# Patient Record
Sex: Female | Born: 1960 | Race: White | Hispanic: No | Marital: Married | Smoking: Never smoker
Health system: Southern US, Community
[De-identification: ages and names within clinical notes are randomized; demographics above are authoritative.]

## PROBLEM LIST (undated history)

## (undated) DIAGNOSIS — T8859XA Other complications of anesthesia, initial encounter: Secondary | ICD-10-CM

## (undated) DIAGNOSIS — Z9889 Other specified postprocedural states: Secondary | ICD-10-CM

## (undated) DIAGNOSIS — N200 Calculus of kidney: Secondary | ICD-10-CM

## (undated) DIAGNOSIS — Z87442 Personal history of urinary calculi: Secondary | ICD-10-CM

## (undated) DIAGNOSIS — D649 Anemia, unspecified: Secondary | ICD-10-CM

## (undated) DIAGNOSIS — J189 Pneumonia, unspecified organism: Secondary | ICD-10-CM

## (undated) HISTORY — PX: TUBAL LIGATION: SHX77

## (undated) HISTORY — PX: APPENDECTOMY: SHX54

## (undated) HISTORY — PX: ABDOMINAL HYSTERECTOMY: SHX81

## (undated) HISTORY — PX: COLON SURGERY: SHX602

## (undated) HISTORY — DX: Calculus of kidney: N20.0

---

## 2007-06-24 ENCOUNTER — Emergency Department: Payer: Self-pay | Admitting: Emergency Medicine

## 2008-05-24 DIAGNOSIS — D239 Other benign neoplasm of skin, unspecified: Secondary | ICD-10-CM

## 2008-05-24 HISTORY — DX: Other benign neoplasm of skin, unspecified: D23.9

## 2008-12-20 ENCOUNTER — Emergency Department: Payer: Self-pay | Admitting: Emergency Medicine

## 2011-06-04 ENCOUNTER — Ambulatory Visit: Payer: Self-pay | Admitting: Family Medicine

## 2015-01-17 ENCOUNTER — Ambulatory Visit: Payer: No Typology Code available for payment source

## 2015-01-17 ENCOUNTER — Ambulatory Visit
Admission: EM | Admit: 2015-01-17 | Discharge: 2015-01-17 | Disposition: A | Discharge status: Home / Self Care | Payer: No Typology Code available for payment source | Attending: Family Medicine | Admitting: Family Medicine

## 2015-01-17 DIAGNOSIS — J40 Bronchitis, not specified as acute or chronic: Secondary | ICD-10-CM | POA: Diagnosis not present

## 2015-01-17 DIAGNOSIS — J0100 Acute maxillary sinusitis, unspecified: Secondary | ICD-10-CM

## 2015-01-17 DIAGNOSIS — J0110 Acute frontal sinusitis, unspecified: Secondary | ICD-10-CM

## 2015-01-17 MED ORDER — AZITHROMYCIN 250 MG PO TABS: ORAL_TABLET | ORAL | Status: DC

## 2015-01-17 MED ORDER — PREDNISONE 20 MG PO TABS: 20.0000 mg | ORAL_TABLET | Freq: Every day | ORAL | Status: DC

## 2015-01-17 MED ORDER — GUAIFENESIN-CODEINE 100-10 MG/5ML PO SOLN: 5.0000 mL | Freq: Three times a day (TID) | ORAL | Status: DC | PRN

## 2015-01-17 NOTE — ED Provider Notes (Signed)
[facility] Emergency Department Provider Note  ____________________________________________  Time seen: Approximately 2:39 PM  I have reviewed the triage vital signs and the nursing notes.   HISTORY  Chief Complaint Cough and Nasal Congestion   HPI Margaret Sellers is a 54 y.o. female presents for the complaints of 2-3 weeks of runny nose, congestion, sinus pressure and cough. States in the last week cough has increased. Patient reports that she now feels like she is having a lot of chest congestion. States she is frequently getting yellowish greenish mucus from nose and from cough. Reports cough is worse at night when lying down she can feel drainage in the back of her throat. Denies fever. Denies wheezing.  Reports continues to eat and drink well. States sinus pressure discomfort is 3 out of 10. Denies other pain.  Denies chest pain, shortness breath, abdominal pain, fever, neck pain or other complaints. 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   History reviewed. No pertinent past medical history.  There are no active problems to display for this patient.   History reviewed. No pertinent past surgical history.  No current outpatient prescriptions on file.  Allergies Sulfa antibiotics  No family history on file.  Social History Social History  Substance Use Topics  . Smoking status: Never Smoker   . Smokeless tobacco: None  . Alcohol Use: No    Review of Systems Constitutional: No fever/chills Eyes: No visual changes. ENT: positive runny nose, congestion and cough. Cardiovascular: Denies chest pain. Respiratory: Denies shortness of breath. Gastrointestinal: No abdominal pain.  No nausea, no vomiting.  No diarrhea.  No constipation. Genitourinary: Negative for dysuria. Musculoskeletal: Negative for back pain. Skin: Negative for rash. Neurological: Negative for headaches, focal weakness or numbness.  10-point ROS otherwise  negative.  ____________________________________________   PHYSICAL EXAM:  VITAL SIGNS: ED Triage Vitals  Enc Vitals Group     BP 01/17/15 1358 119/61 mmHg     Pulse Rate 01/17/15 1358 70     Resp 01/17/15 1358 20     Temp 01/17/15 1358 97.8 F (36.6 C)     Temp Source 01/17/15 1358 Tympanic     SpO2 01/17/15 1358 100 %     Weight 01/17/15 1358 192 lb (87.091 kg)     Height 01/17/15 1358 5\' 6"  (1.676 m)     Head Cir --      Peak Flow --      Pain Score 01/17/15 1402 0     Pain Loc --      Pain Edu? --      Excl. in [place]? --     Constitutional: Alert and oriented. Well appearing and in no acute distress. Eyes: Conjunctivae are normal. PERRL. EOMI. Head: Atraumatic. Mild to mod TTP maxillary and frontal sinus TTP. No erythema. No swelling.   Ears: no erythema, normal TMs bilaterally.   Nose: nasal congestion with bilateral nasal turbinate edema and bogginess.   Mouth/Throat: Mucous membranes are moist.  Oropharynx non-erythematous. Neck: No stridor.  No cervical spine tenderness to palpation. Hematological/Lymphatic/Immunilogical: No cervical lymphadenopathy. Cardiovascular: Normal rate, regular rhythm. Grossly normal heart sounds.  Good peripheral circulation. Respiratory: Normal respiratory effort.  No retractions. Bilateral base rhonchi. No wheezes or rales. Good air movement. Intermittent dry cough in room.  Gastrointestinal: Soft and nontender. No distention. Normal Bowel sounds.  No CVA tenderness. Musculoskeletal: No lower or upper extremity tenderness nor edema.  No joint effusions. Bilateral pedal pulses equal and easily palpated.  Neurologic:  Normal speech and language. No gross focal neurologic deficits are appreciated. No gait instability. Skin:  Skin is warm, dry and intact. No rash noted. Psychiatric: Mood and affect are normal. Speech and behavior are normal.  ____________________________________________   LABS (all labs ordered are listed, but only abnormal  results are displayed)  Labs Reviewed - No data to display  RADIOLOGY  EXAM: CHEST 2 VIEW  COMPARISON: 08/06/2009  FINDINGS: Mild hyperinflation. Mild convex left thoracic spine curvature. Midline trachea. Normal heart size and mediastinal contours. No pleural effusion or pneumothorax. Biapical pleural thickening. Clear lungs.  IMPRESSION: Mild hyperinflation, without acute disease.   Electronically Signed By: Abigail Miyamoto M.D. On: 01/17/2015 15:21  I, Marylene Land, personally viewed and evaluated these images (plain radiographs) as part of my medical decision making.   ____________________________________________   INITIAL IMPRESSION / ASSESSMENT AND PLAN / ED COURSE  Pertinent labs & imaging results that were available during my care of the patient were reviewed by me and considered in my medical decision making (see chart for details).  Very well-appearing patient. No acute distress. Patient with active cough in room. Rhonchi bilateral base lungs, no wheezing, no rales. Moist mucous membranes. Abdomen soft and nontender. Nasal congestion and sinus pressure. Will also evaluate chest x-ray.  Chest xray mild hyperinflation, without acute distress. Will treat sinusitis and bronchitis with oral azithromycin, prn guaifenesin/codeine, and 3 days prednisone. Discussed supportive treatments including rest and fluids. Discussed follow up with Primary care physician this week. Discussed follow up and return parameters including no resolution or any worsening concerns. Patient verbalized understanding and agreed to plan.   ____________________________________________   FINAL CLINICAL IMPRESSION(S) / ED DIAGNOSES  Final diagnoses:  Acute maxillary sinusitis, recurrence not specified  Acute frontal sinusitis, recurrence not specified  Bronchitis       Marylene Land, NP 01/17/15 1539

## 2015-01-17 NOTE — ED Notes (Signed)
Pt states "I have congestion in my head for the last three weeks, but now I have this cough and feel worse. Yellow productive cough."

## 2015-01-17 NOTE — Discharge Instructions (Signed)
Sinusitis, Adult  Sinusitis is redness, soreness, and puffiness (inflammation) of the air pockets in the bones of your face (sinuses). The redness, soreness, and puffiness can cause air and mucus to get trapped in your sinuses. This can allow germs to grow and cause an infection.   HOME CARE    Drink enough fluids to keep your pee (urine) clear or pale yellow.   Use a humidifier in your home.   Run a hot shower to create steam in the bathroom. Sit in the bathroom with the door closed. Breathe in the steam 3-4 times a day.   Put a warm, moist washcloth on your face 3-4 times a day, or as told by your doctor.   Use salt water sprays (saline sprays) to wet the thick fluid in your nose. This can help the sinuses drain.   Only take medicine as told by your doctor.  GET HELP RIGHT AWAY IF:    Your pain gets worse.   You have very bad headaches.   You are sick to your stomach (nauseous).   You throw up (vomit).   You are very sleepy (drowsy) all the time.   Your face is puffy (swollen).   Your vision changes.   You have a stiff neck.   You have trouble breathing.  MAKE SURE YOU:    Understand these instructions.   Will watch your condition.   Will get help right away if you are not doing well or get worse.     This information is not intended to replace advice given to you by your health care provider. Make sure you discuss any questions you have with your health care provider.     Document Released: 09/10/2007 Document Revised: 04/14/2014 Document Reviewed: 10/28/2011  Elsevier Interactive Patient Education 2016 [employer]

## 2017-07-13 ENCOUNTER — Emergency Department
Admission: EM | Admit: 2017-07-13 | Discharge: 2017-07-13 | Disposition: A | Discharge status: Home / Self Care | Payer: BLUE CROSS/BLUE SHIELD | Attending: Student in an Organized Health Care Education/Training Program | Admitting: Student in an Organized Health Care Education/Training Program

## 2017-07-13 DIAGNOSIS — Z79899 Other long term (current) drug therapy: Secondary | ICD-10-CM | POA: Diagnosis not present

## 2017-07-13 DIAGNOSIS — T782XXA Anaphylactic shock, unspecified, initial encounter: Secondary | ICD-10-CM

## 2017-07-13 DIAGNOSIS — T886XXA Anaphylactic reaction due to adverse effect of correct drug or medicament properly administered, initial encounter: Secondary | ICD-10-CM | POA: Diagnosis not present

## 2017-07-13 DIAGNOSIS — R0989 Other specified symptoms and signs involving the circulatory and respiratory systems: Secondary | ICD-10-CM | POA: Diagnosis present

## 2017-07-13 DIAGNOSIS — Y829 Unspecified medical devices associated with adverse incidents: Secondary | ICD-10-CM | POA: Diagnosis not present

## 2017-07-13 MED ORDER — EPINEPHRINE 0.3 MG/0.3ML IJ SOAJ
0.3000 mg | Freq: Once | INTRAMUSCULAR | Status: AC
  Administered 2017-07-13: 0.3 mg via INTRAMUSCULAR

## 2017-07-13 MED ORDER — PREDNISONE 10 MG PO TABS: 10.0000 mg | ORAL_TABLET | Freq: Every day | ORAL | 0 refills | Status: DC

## 2017-07-13 MED ORDER — EPINEPHRINE 0.3 MG/0.3ML IJ SOAJ
INTRAMUSCULAR | Status: AC
  Administered 2017-07-13: 0.3 mg via INTRAMUSCULAR
  Filled 2017-07-13: qty 0.3

## 2017-07-13 MED ORDER — FAMOTIDINE IN NACL 20-0.9 MG/50ML-% IV SOLN
20.0000 mg | Freq: Once | INTRAVENOUS | Status: AC
  Administered 2017-07-13: 20 mg via INTRAVENOUS

## 2017-07-13 MED ORDER — EPINEPHRINE 0.3 MG/0.3ML IJ SOAJ: 0.3000 mg | Freq: Once | INTRAMUSCULAR | 0 refills | Status: AC

## 2017-07-13 NOTE — ED Provider Notes (Signed)
[facility] Emergency Department Provider Note    First MD Initiated Contact with Patient 07/13/17 1657     (approximate)  I have reviewed the triage vital signs and the nursing notes.   HISTORY  Chief Complaint Allergic Reaction     HPI Margaret Sellers is a 57 y.o. female presents with chief complaint of throat swelling redness and itching all over that occurred after she took Augmentin for an upper respiratory infection today.  Patient was divided by EMS was still protecting her airway she was given IM epinephrine as well as IV Solu-Medrol Benadryl and Zofran.  Patient felt like she was about to evacuate her bowels as well as having some nausea.  Has had allergic reactions to sulfa antibiotics with his first time she is taken penicillins.  No past medical history on file. No family history on file. No past surgical history on file. There are no active problems to display for this patient.     Prior to Admission medications   Medication Sig Start Date End Date Taking? Authorizing Provider  azithromycin (ZITHROMAX Z-PAK) 250 MG tablet Take 2 tablets (500 mg) on  Day 1,  followed by 1 tablet (250 mg) once daily on Days 2 through 5. 01/17/15   Marylene Land, NP  EPINEPHrine 0.3 mg/0.3 mL IJ SOAJ injection Inject 0.3 mLs (0.3 mg total) into the muscle once for 1 dose. 07/13/17 07/13/17  Merlyn Lot, MD  guaiFENesin-codeine 100-10 MG/5ML syrup Take 5 mLs by mouth 3 (three) times daily as needed for cough. 01/17/15   Marylene Land, NP  predniSONE (DELTASONE) 10 MG tablet Take 1 tablet (10 mg total) by mouth daily. Day 1-2: Take 50 mg  ( 5 pills) Day 3-4 : Take 40 mg (4pills) Day 5-6: Take 30 mg (3 pills) Day 7-8:  Take 20 mg (2 pills) Day 9:  Take 10mg  (1 pill) 07/13/17   Merlyn Lot, MD  predniSONE (DELTASONE) 20 MG tablet Take 1 tablet (20 mg total) by mouth daily. 01/17/15   Marylene Land, NP    Allergies Augmentin [amoxicillin-pot  clavulanate] and Sulfa antibiotics    Social History Social History   Tobacco Use  . Smoking status: Never Smoker  Substance Use Topics  . Alcohol use: No  . Drug use: No    Review of Systems Patient denies headaches, rhinorrhea, blurry vision, numbness, shortness of breath, chest pain, edema, cough, abdominal pain, nausea, vomiting, diarrhea, dysuria, fevers, rashes or hallucinations unless otherwise stated above in HPI. ____________________________________________   PHYSICAL EXAM:  VITAL SIGNS: Vitals:   07/13/17 1658 07/13/17 1701  BP:  (!) 164/70  Pulse:  93  Resp:  16  Temp:  (!) 97.2 F (36.2 C)  SpO2: 96% 97%    Constitutional: Alert and oriented.  Red with diffuse urticaria protecting her airway  eyes: Conjunctivae are normal.  Head: Atraumatic. Nose: No congestion/rhinnorhea. Mouth/Throat: Mucous membranes are moist.  Uvular edema or tongue swelling. Neck: No stridor. Painless ROM.  Cardiovascular: Normal rate, regular rhythm. Grossly normal heart sounds.  Good peripheral circulation. Respiratory: Normal respiratory effort.  No retractions. Lungs CTAB. Gastrointestinal: Soft and nontender. No distention. No abdominal bruits. No CVA tenderness. Genitourinary:  Musculoskeletal: No lower extremity tenderness nor edema.  No joint effusions. Neurologic:  Normal speech and language. No gross focal neurologic deficits are appreciated. No facial droop Skin:  Skin is warm, dry and intact. No rash noted. Psychiatric: Mood and affect are normal. Speech and behavior are normal.  ____________________________________________   LABS (all labs ordered are listed, but only abnormal results are displayed)  No results found for this or any previous visit (from the past 24 hour(s)). ____________________________________________ ____________________________________________  [id number]   ____________________________________________   PROCEDURES  Procedure(s)  performed:  .Critical Care Performed by: Merlyn Lot, MD Authorized by: Merlyn Lot, MD   Critical care provider statement:    Critical care time (minutes):  5   Critical care time was exclusive of:  Separately billable procedures and treating other patients   Critical care was necessary to treat or prevent imminent or life-threatening deterioration of the following conditions: anaphylaxis.   Critical care was time spent personally by me on the following activities:  Development of treatment plan with patient or surrogate, discussions with consultants, evaluation of patient's response to treatment, examination of patient, obtaining history from patient or surrogate, ordering and performing treatments and interventions, ordering and review of laboratory studies, ordering and review of radiographic studies, pulse oximetry, re-evaluation of patient's condition and review of old charts      Critical Care performed: yes ____________________________________________   INITIAL IMPRESSION / [place] / ED COURSE  Pertinent labs & imaging results that were available during my care of the patient were reviewed by me and considered in my medical decision making (see chart for details).  DDX: anaphylaxis, urticaria, anaphylactoid, sjs  Margaret Sellers is a 57 y.o. who presents to the ED with allergic reaction as described above.  Patient already received epi but will will give additional dose here and continue to observe.  She is currently protecting her airway.  Symptoms seem to be most likely be related to the Augmentin given for her sinusitis.  Possible food allergy.  Regardless given the severity of her symptoms will keep on monitor and reassess.  Clinical Course as of Jul 13 2037  Mon Jul 13, 2017  1725 Patient reassessed.  Symptoms are significantly improved.  No shortness of breath.  No stridor.  No more symptoms or sensation that she needs to evacuate her bowels or vomit.     [PR]  2039 Patient reassessed in no acute distress.  Symptoms have completely resolved.  At this point do believe she stable and appropriate for discharge home.  Discussed need for cessation of her antibiotic and follow-up with allergist.  Have discussed with the patient and available family all diagnostics and treatments performed thus far and all questions were answered to the best of my ability. The patient demonstrates understanding and agreement with plan.    [PR]    Clinical Course User Index [PR] Merlyn Lot, MD     As part of my medical decision making, I reviewed the following data within the [place] notes reviewed and incorporated, Labs reviewed, notes from prior ED visits.   ____________________________________________   FINAL CLINICAL IMPRESSION(S) / ED DIAGNOSES  Final diagnoses:  Anaphylaxis, initial encounter      NEW MEDICATIONS STARTED DURING THIS VISIT:  New Prescriptions   EPINEPHRINE 0.3 MG/0.3 ML IJ SOAJ INJECTION    Inject 0.3 mLs (0.3 mg total) into the muscle once for 1 dose.   PREDNISONE (DELTASONE) 10 MG TABLET    Take 1 tablet (10 mg total) by mouth daily. Day 1-2: Take 50 mg  ( 5 pills) Day 3-4 : Take 40 mg (4pills) Day 5-6: Take 30 mg (3 pills) Day 7-8:  Take 20 mg (2 pills) Day 9:  Take 10mg  (1 pill)  Note:  This document was prepared using Dragon voice recognition software and may include unintentional dictation errors.    Merlyn Lot, MD 07/13/17 2039

## 2017-07-13 NOTE — ED Triage Notes (Signed)
Pt brought in by [facility] from parking lot.  Pt was prescribed augmentin for URI and started to notice throat swelling, redness and itching all over.  PT called EMS and was given 125mg  solumedrol IV, 50mg  benadryl IV, 4mg  zofran IV, and 0.3mg  epinephrine IM via [facility] in route.  PT was also given a total of 600cc of fluid in route.  Pt otherwise has stable vitals at this time.  EDP at bedside and given VO for another epi pen IM injection and famotidine 20mg  IV at this time.

## 2018-02-28 ENCOUNTER — Ambulatory Visit
Admission: EM | Admit: 2018-02-28 | Discharge: 2018-02-28 | Disposition: A | Discharge status: Home / Self Care | Payer: Managed Care, Other (non HMO) | Attending: Family Medicine | Admitting: Family Medicine

## 2018-02-28 DIAGNOSIS — X58XXXA Exposure to other specified factors, initial encounter: Secondary | ICD-10-CM

## 2018-02-28 DIAGNOSIS — S0501XA Injury of conjunctiva and corneal abrasion without foreign body, right eye, initial encounter: Secondary | ICD-10-CM

## 2018-02-28 MED ORDER — MOXIFLOXACIN HCL 0.5 % OP SOLN: 1.0000 [drp] | Freq: Three times a day (TID) | OPHTHALMIC | 0 refills | Status: DC

## 2018-02-28 NOTE — ED Provider Notes (Signed)
[facility]    CSN: [id number] Arrival date & time: 02/28/18  0807     History   Chief Complaint Chief Complaint  Patient presents with  . Eye Pain    HPI Margaret Sellers is a 57 y.o. female.   57 yo female with a c/o right eye pain since this morning, associated with light sensitivity and watery. Denies any vision changes, thick discharge, or any eye injuries.  States she woke up this morning with the symptoms.   The history is provided by the patient.    History reviewed. No pertinent past medical history.  There are no active problems to display for this patient.   History reviewed. No pertinent surgical history.  OB History   None      Home Medications    Prior to Admission medications   Medication Sig Start Date End Date Taking? Authorizing Provider  azithromycin (ZITHROMAX Z-PAK) 250 MG tablet Take 2 tablets (500 mg) on  Day 1,  followed by 1 tablet (250 mg) once daily on Days 2 through 5. 01/17/15   Marylene Land, NP  guaiFENesin-codeine 100-10 MG/5ML syrup Take 5 mLs by mouth 3 (three) times daily as needed for cough. 01/17/15   Marylene Land, NP  moxifloxacin (VIGAMOX) 0.5 % ophthalmic solution Place 1 drop into the right eye 3 (three) times daily. 02/28/18   Norval Gable, MD  predniSONE (DELTASONE) 10 MG tablet Take 1 tablet (10 mg total) by mouth daily. Day 1-2: Take 50 mg  ( 5 pills) Day 3-4 : Take 40 mg (4pills) Day 5-6: Take 30 mg (3 pills) Day 7-8:  Take 20 mg (2 pills) Day 9:  Take 10mg  (1 pill) 07/13/17   Merlyn Lot, MD  predniSONE (DELTASONE) 20 MG tablet Take 1 tablet (20 mg total) by mouth daily. 01/17/15   Marylene Land, NP    Family History Family History  Problem Relation Age of Onset  . Healthy Mother   . Alzheimer's disease Father     Social History Social History   Tobacco Use  . Smoking status: Never Smoker  . Smokeless tobacco: Never Used  Substance Use Topics  . Alcohol use: No  . Drug use: No      Allergies   Augmentin [amoxicillin-pot clavulanate] and Sulfa antibiotics   Review of Systems Review of Systems   Physical Exam Triage Vital Signs ED Triage Vitals  Enc Vitals Group     BP 02/28/18 0817 137/74     Pulse Rate 02/28/18 0817 (!) 56     Resp 02/28/18 0817 18     Temp 02/28/18 0817 97.7 F (36.5 C)     Temp Source 02/28/18 0817 Oral     SpO2 02/28/18 0817 100 %     Weight 02/28/18 0819 210 lb (95.3 kg)     Height --      Head Circumference --      Peak Flow --      Pain Score 02/28/18 0819 8     Pain Loc --      Pain Edu? --      Excl. in [place]? --    No data found.  Updated Vital Signs BP 137/74 (BP Location: Right Arm)   Pulse (!) 56   Temp 97.7 F (36.5 C) (Oral)   Resp 18   Wt 95.3 kg   SpO2 100%   BMI 32.89 kg/m   Visual Acuity Right Eye Distance: 20/30(corrected) Left Eye Distance: 20/25(corrected) Bilateral Distance:  Right Eye Near:   Left Eye Near:    Bilateral Near:     Physical Exam  Constitutional: She appears well-developed and well-nourished. No distress.  Eyes: Pupils are equal, round, and reactive to light. EOM and lids are normal. Lids are everted and swept, no foreign bodies found. Right eye exhibits no discharge and no exudate. No foreign body present in the right eye. Left eye exhibits no discharge and no exudate. No foreign body present in the left eye. Right conjunctiva is injected.  Slit lamp exam:      The right eye shows corneal abrasion and fluorescein uptake.    Skin: She is not diaphoretic.  Nursing note and vitals reviewed.    UC Treatments / Results  Labs (all labs ordered are listed, but only abnormal results are displayed) Labs Reviewed - No data to display  EKG None  Radiology No results found.  Procedures Procedures (including critical care time)  Medications Ordered in UC Medications - No data to display  Initial Impression / Assessment and Plan / UC Course  I have reviewed the triage  vital signs and the nursing notes.  Pertinent labs & imaging results that were available during my care of the patient were reviewed by me and considered in my medical decision making (see chart for details).      Final Clinical Impressions(s) / UC Diagnoses   Final diagnoses:  Abrasion of right cornea, initial encounter     Discharge Instructions     Follow up with your eye doctor in the next 24-48 if symptoms not improving or if worsening    ED Prescriptions    Medication Sig Dispense Auth. Provider   moxifloxacin (VIGAMOX) 0.5 % ophthalmic solution Place 1 drop into the right eye 3 (three) times daily. 3 mL Norval Gable, MD     1. diagnosis reviewed with patient 2. rx as per orders above; reviewed possible side effects, interactions, risks and benefits  3. Recommend supportive treatment with otc analgesics prn 4. Follow-up prn if symptoms worsen or don't improve   Controlled Substance Prescriptions [place] Controlled Substance Registry consulted? Not Applicable   Norval Gable, MD 02/28/18 1113

## 2018-02-28 NOTE — Discharge Instructions (Signed)
Follow up with your eye doctor in the next 24-48 if symptoms not improving or if worsening

## 2018-02-28 NOTE — ED Triage Notes (Signed)
Pt here for right eye pain. States she woke up this morning and it was red and slightly painful but has gotten worse. States it's very sensitive to the light and feels like a "stabbing pain" It does look red and irritated. No vision changes reported. Does report watery eyes and drainage from the right eye.

## 2018-05-31 ENCOUNTER — Other Ambulatory Visit: Payer: Self-pay | Admitting: Family Medicine

## 2018-05-31 ENCOUNTER — Other Ambulatory Visit (HOSPITAL_COMMUNITY): Payer: Self-pay | Admitting: Family Medicine

## 2018-05-31 DIAGNOSIS — R319 Hematuria, unspecified: Secondary | ICD-10-CM

## 2018-06-08 ENCOUNTER — Ambulatory Visit: Admission: RE | Admit: 2018-06-08 | Payer: Managed Care, Other (non HMO) | Source: Ambulatory Visit

## 2018-06-09 ENCOUNTER — Ambulatory Visit
Admission: RE | Admit: 2018-06-09 | Discharge: 2018-06-09 | Disposition: A | Discharge status: Home / Self Care | Payer: 59 | Source: Ambulatory Visit | Attending: Family Medicine | Admitting: Family Medicine

## 2018-06-09 DIAGNOSIS — R319 Hematuria, unspecified: Secondary | ICD-10-CM | POA: Diagnosis not present

## 2018-06-09 MED ORDER — IOHEXOL 300 MG/ML  SOLN
125.0000 mL | Freq: Once | INTRAMUSCULAR | Status: AC | PRN
  Administered 2018-06-09: 125 mL via INTRAVENOUS

## 2018-06-10 DIAGNOSIS — N200 Calculus of kidney: Secondary | ICD-10-CM | POA: Insufficient documentation

## 2018-06-10 DIAGNOSIS — K769 Liver disease, unspecified: Secondary | ICD-10-CM | POA: Insufficient documentation

## 2018-07-13 ENCOUNTER — Telehealth: Payer: Self-pay | Admitting: Urology

## 2018-07-15 ENCOUNTER — Ambulatory Visit: Payer: Self-pay | Admitting: Urology

## 2018-07-20 ENCOUNTER — Telehealth (INDEPENDENT_AMBULATORY_CARE_PROVIDER_SITE_OTHER): Payer: Self-pay | Admitting: Urology

## 2018-07-20 ENCOUNTER — Other Ambulatory Visit: Payer: Self-pay

## 2018-07-20 DIAGNOSIS — N200 Calculus of kidney: Secondary | ICD-10-CM

## 2018-07-20 NOTE — Progress Notes (Signed)
Virtual Visit via Telephone Note  I connected with Margaret Sellers on 07/20/18 at 11:30 AM EDT by telephone and verified that I am speaking with the correct person using two identifiers.   I discussed the limitations, risks, security and privacy concerns of performing an evaluation and management service by telephone and the availability of in person appointments. We discussed the impact of the COVID-19 on the healthcare system, and the importance of social distancing and reducing patient and provider exposure. I also discussed with the patient that there may be a patient responsible charge related to this service. The patient expressed understanding and agreed to proceed.  Reason for visit: Microscopic hematuria, nephrolithiasis  History of Present Illness: Margaret Sellers is a 58 year old healthy female that is referred for microscopic hematuria with 4-10 RBCs per high-power field, and nonobstructive nephrolithiasis.  She denies any symptoms of flank pain at this time.  She has an extensive history of nephrolithiasis with reportedly 20 to 30 stone spontaneously passed, never requiring surgical intervention, with last stone passed in December 2019.  She works as an Therapist, sports at [employer].  She has a 7-pack-year smoking history, and quit 2 years ago.  She denies any other carcinogenic exposures.  Her past surgical history is notable for reportedly 20 cm of sigmoid colon resected for endometriosis approximately 12 years ago.  She underwent a CT urogram with her PCP on 06/09/2018 that showed no renal masses or filling defects, however there was a 5 mm right mid pole renal stone, as well as a 7 mm left lower pole stone, and 3 mm left mid pole stone.  The larger 7 mm left lower pole stone measured 1000HU, with 10 cm skin stone distance.  Not clearly able to be seen on scout CT.  She denies any history of gross hematuria.   Assessment and Plan: 58 year old healthy female with extensive history of nephrolithiasis and  nonobstructing stones currently on CT scan, as well as microscopic hematuria.  We discussed common possible etiologies of hematuria including BPH, malignancy, urolithiasis, medical renal disease, and idiopathic. Standard workup recommended by the [facility] includes imaging with CT urogram to assess the upper tracts, and cystoscopy.   We discussed general stone prevention strategies including adequate hydration with goal of producing 2.5 L of urine daily, increasing citric acid intake, increasing calcium intake during high oxalate meals, minimizing animal protein, and decreasing salt intake.  We discussed various treatment options for urolithiasis including observation with or without medical expulsive therapy, shockwave lithotripsy (SWL), ureteroscopy and laser lithotripsy with stent placement, and percutaneous nephrolithotomy.  We discussed that management is based on stone size, location, density, patient co-morbidities, and patient preference.   Stones <74mm in size have a >80% spontaneous passage rate. Data surrounding the use of tamsulosin for medical expulsive therapy is controversial, but meta analyses suggests it is most efficacious for distal stones between 5-80mm in size. Possible side effects include dizziness/lightheadedness, and retrograde ejaculation.  SWL has a lower stone free rate in a single procedure, but also a lower complication rate compared to ureteroscopy and avoids a stent and associated stent related symptoms. Possible complications include renal hematoma, steinstrasse, and need for additional treatment.  Ureteroscopy with laser lithotripsy and stent placement has a higher stone free rate than SWL in a single procedure, however increased complication rate including possible infection, ureteral injury, bleeding, and stent related morbidity. Common stent related symptoms include dysuria, urgency/frequency, and flank pain.  She would prefer to undergo shockwave lithotripsy, as she  would  like to avoid a ureteral stent.  Follow Up: -RTC 6-8 weeks for cystoscopy with KUB prior -Discuss Left SWL v URS again at that visit -24 hour urine in future in setting of extensive stone disease   I discussed the assessment and treatment plan with the patient. The patient was provided an opportunity to ask questions and all were answered. The patient agreed with the plan and demonstrated an understanding of the instructions.   The patient was advised to call back or seek an in-person evaluation if the symptoms worsen or if the condition fails to improve as anticipated.  I provided 16 minutes of non-face-to-face time during this encounter.   Billey Co, MD

## 2018-09-16 ENCOUNTER — Telehealth: Payer: Self-pay | Admitting: Radiology

## 2018-09-16 ENCOUNTER — Other Ambulatory Visit: Payer: Self-pay

## 2018-09-16 ENCOUNTER — Ambulatory Visit (INDEPENDENT_AMBULATORY_CARE_PROVIDER_SITE_OTHER): Payer: Managed Care, Other (non HMO) | Admitting: Urology

## 2018-09-16 ENCOUNTER — Ambulatory Visit
Admission: RE | Admit: 2018-09-16 | Discharge: 2018-09-16 | Disposition: A | Discharge status: Home / Self Care | Payer: 59 | Source: Ambulatory Visit | Attending: Urology | Admitting: Urology

## 2018-09-16 ENCOUNTER — Ambulatory Visit
Admission: RE | Admit: 2018-09-16 | Discharge: 2018-09-16 | Disposition: A | Discharge status: Home / Self Care | Payer: 59 | Attending: Urology | Admitting: Urology

## 2018-09-16 ENCOUNTER — Other Ambulatory Visit: Payer: Self-pay | Admitting: Radiology

## 2018-09-16 ENCOUNTER — Encounter: Payer: Self-pay | Admitting: Urology

## 2018-09-16 VITALS — BP 111/73 | HR 61 | Ht 67.0 in | Wt 216.0 lb

## 2018-09-16 DIAGNOSIS — N200 Calculus of kidney: Secondary | ICD-10-CM

## 2018-09-16 LAB — URINALYSIS, COMPLETE
Bilirubin, UA: NEGATIVE
Glucose, UA: NEGATIVE
Ketones, UA: NEGATIVE
Leukocytes,UA: NEGATIVE
Nitrite, UA: NEGATIVE
Protein,UA: NEGATIVE
Specific Gravity, UA: 1.025 (ref 1.005–1.030)
Urobilinogen, Ur: 4 mg/dL — ABNORMAL HIGH (ref 0.2–1.0)
pH, UA: 6 (ref 5.0–7.5)

## 2018-09-16 LAB — MICROSCOPIC EXAMINATION: Bacteria, UA: NONE SEEN

## 2018-09-16 NOTE — Patient Instructions (Signed)
Lithotripsy  Lithotripsy is a treatment that can sometimes help eliminate kidney stones and the pain that they cause. A form of lithotripsy, also known as extracorporeal shock wave lithotripsy, is a nonsurgical procedure that crushes a kidney stone with shock waves. These shock waves pass through your body and focus on the kidney stone. They cause the kidney stone to break up while it is still in the urinary tract. This makes it easier for the smaller pieces of stone to pass in the urine. Tell a health care provider about:  Any allergies you have.  All medicines you are taking, including vitamins, herbs, eye drops, creams, and over-the-counter medicines.  Any blood disorders you have.  Any surgeries you have had.  Any medical conditions you have.  Whether you are pregnant or may be pregnant.  Any problems you or family members have had with anesthetic medicines. What are the risks? Generally, this is a safe procedure. However, problems may occur, including:  Infection.  Bleeding of the kidney.  Bruising of the kidney or skin.  Scarring of the kidney, which can lead to: ? Increased blood pressure. ? Poor kidney function. ? Return (recurrence) of kidney stones.  Damage to other structures or organs, such as the liver, colon, spleen, or pancreas.  Blockage (obstruction) of the the tube that carries urine from the kidney to the bladder (ureter).  Failure of the kidney stone to break into pieces (fragments). What happens before the procedure? Staying hydrated Follow instructions from your health care provider about hydration, which may include:  Up to 2 hours before the procedure - you may continue to drink clear liquids, such as water, clear fruit juice, black coffee, and plain tea. Eating and drinking restrictions Follow instructions from your health care provider about eating and drinking, which may include:  8 hours before the procedure - stop eating heavy meals or foods  such as meat, fried foods, or fatty foods.  6 hours before the procedure - stop eating light meals or foods, such as toast or cereal.  6 hours before the procedure - stop drinking milk or drinks that contain milk.  2 hours before the procedure - stop drinking clear liquids. General instructions  Plan to have someone take you home from the hospital or clinic.  Ask your health care provider about: ? Changing or stopping your regular medicines. This is especially important if you are taking diabetes medicines or blood thinners. ? Taking medicines such as aspirin and ibuprofen. These medicines and other NSAIDs can thin your blood. Do not take these medicines for 7 days before your procedure if your health care provider instructs you not to.  You may have tests, such as: ? Blood tests. ? Urine tests. ? Imaging tests, such as a CT scan. What happens during the procedure?  To lower your risk of infection: ? Your health care team will wash or sanitize their hands. ? Your skin will be washed with soap.  An IV tube will be inserted into one of your veins. This tube will give you fluids and medicines.  You will be given one or more of the following: ? A medicine to help you relax (sedative). ? A medicine to make you fall asleep (general anesthetic).  A water-filled cushion may be placed behind your kidney or on your abdomen. In some cases you may be placed in a tub of lukewarm water.  Your body will be positioned in a way that makes it easy to target the kidney   stone.  A flexible tube with holes in it (stent) may be placed in the ureter. This will help keep urine flowing from the kidney if the fragments of the stone have been blocking the ureter.  An X-ray or ultrasound exam will be done to locate your stone.  Shock waves will be aimed at the stone. If you are awake, you may feel a tapping sensation as the shock waves pass through your body. The procedure may vary among health care  providers and hospitals. What happens after the procedure?  You may have an X-ray to see whether the procedure was able to break up the kidney stone and how much of the stone has passed. If large stone fragments remain after treatment, you may need to have a second procedure at a later time.  Your blood pressure, heart rate, breathing rate, and blood oxygen level will be monitored until the medicines you were given have worn off.  You may be given antibiotics or pain medicine as needed.  If a stent was placed in your ureter during surgery, it may stay in place for a few weeks.  You may need strain your urine to collect pieces of the kidney stone for testing.  You will need to drink plenty of water.  Do not drive for 24 hours if you were given a sedative. Summary  Lithotripsy is a treatment that can sometimes help eliminate kidney stones and the pain that they cause.  A form of lithotripsy, also known as extracorporeal shock wave lithotripsy, is a nonsurgical procedure that crushes a kidney stone with shock waves.  Generally, this is a safe procedure. However, problems may occur, including damage to the kidney or other organs, infection, or obstruction of the tube that carries urine from the kidney to the bladder (ureter).  When you go home, you will need to drink plenty of water. You may be asked to strain your urine to collect pieces of the kidney stone for testing. This information is not intended to replace advice given to you by your health care provider. Make sure you discuss any questions you have with your health care provider. Document Released: 03/21/2000 Document Revised: 04/09/2017 Document Reviewed: 02/13/2016 Elsevier Interactive Patient Education  2019 [employer]  

## 2018-09-16 NOTE — Telephone Encounter (Signed)
Notified patient of extracorporeal shockwave lithotripsy scheduled on 10/14/2018 at 7:15. Patient should arrive at that time to [facility] Day [facility]. Pre-op & discharge instructions for lithotripsy were provided to patient.  A COVID-19 test will be performed on 10/11/2018. Patient was advised of location & to quarantine following the test until the day of the procedure.  Advised patient to hold NSAIDS & aspirin products for 3 days prior to the procedure beginning on 10/11/2018.  Patient's questions were answered & she expresses understanding of these instructions.    Ranell Patrick, RN

## 2018-09-16 NOTE — Progress Notes (Signed)
Cystoscopy Procedure Note:  Indication: Microscopic hematuria  After informed consent and discussion of the procedure and its risks, Margaret Sellers was positioned and prepped in the standard fashion. Cystoscopy was performed with a flexible cystoscope. The urethra, bladder neck and entire bladder was visualized in a standard fashion. The bladder mucosa was grossly normal throughout. The ureteral orifices were visualized in their normal location and orientation.  No abnormalities on retroflexion.  Imaging: CT and KUB personally reviewed. No renal masses, filling defects, or hydronephrosis. Left 37mm lower pole stone clearly visible on KUB. 1000HU, 10cm SSD.  Findings: Normal cystoscopy  Assessment and Plan: 58 year old female with history of bowel resection and > 20 spontaneously passed kidney stones, as well as microscopic hematuria.  Cystoscopy normal today, and no worrisome findings on CT for malignancy.  We discussed various treatment options for urolithiasis including observation with or without medical expulsive therapy, shockwave lithotripsy (SWL), ureteroscopy and laser lithotripsy with stent placement, and percutaneous nephrolithotomy.  We discussed that management is based on stone size, location, density, patient co-morbidities, and patient preference.   Stones <9mm in size have a >80% spontaneous passage rate. Data surrounding the use of tamsulosin for medical expulsive therapy is controversial, but meta analyses suggests it is most efficacious for distal stones between 5-41mm in size. Possible side effects include dizziness/lightheadedness, and retrograde ejaculation.  SWL has a lower stone free rate in a single procedure, but also a lower complication rate compared to ureteroscopy and avoids a stent and associated stent related symptoms. Possible complications include renal hematoma, steinstrasse, and need for additional treatment.  Ureteroscopy with laser lithotripsy and stent  placement has a higher stone free rate than SWL in a single procedure, however increased complication rate including possible infection, ureteral injury, bleeding, and stent related morbidity. Common stent related symptoms include dysuria, urgency/frequency, and flank pain.  She would like to proceed with shockwave lithotripsy, and she would like to pursue the least invasive approach.  We discussed at length the etiology of stone formations in patients with bowel resection, including the importance of adequate urine volume > 2.5 L/day, addition of citrate in the diet, and most importantly minimizing high oxalate foods and taking in calcium at the same time to reduce renal absorption of oxalate.  Schedule left SWL  Nickolas Madrid, MD 09/16/2018

## 2018-09-17 ENCOUNTER — Other Ambulatory Visit: Payer: Self-pay | Admitting: Radiology

## 2018-09-17 DIAGNOSIS — N200 Calculus of kidney: Secondary | ICD-10-CM

## 2018-10-04 ENCOUNTER — Other Ambulatory Visit: Payer: Managed Care, Other (non HMO)

## 2018-10-07 ENCOUNTER — Telehealth (INDEPENDENT_AMBULATORY_CARE_PROVIDER_SITE_OTHER): Payer: Managed Care, Other (non HMO) | Admitting: Urology

## 2018-10-07 ENCOUNTER — Other Ambulatory Visit: Payer: Self-pay

## 2018-10-07 DIAGNOSIS — N200 Calculus of kidney: Secondary | ICD-10-CM | POA: Diagnosis not present

## 2018-10-07 NOTE — Progress Notes (Signed)
Virtual Visit via Telephone Note  I connected with [name] on 10/07/18 at 12:00 PM EDT by telephone and verified that I am speaking with the correct person using two identifiers.   I discussed the limitations, risks, security and privacy concerns of performing an evaluation and management service by telephone and the availability of in person appointments. We discussed the impact of the COVID-19 pandemic on the healthcare system, and the importance of social distancing and reducing patient and provider exposure. I also discussed with the patient that there may be a patient responsible charge related to this service. The patient expressed understanding and agreed to proceed.  Reason for visit: Nephrolithiasis  History of Present Illness: [name] is a healthy 58 year old female with history of bariatric surgery and 20+ stone episodes in the past.  On microscopic hematuria work-up she was found to have a 7 mm left lower pole stone, 1000HU, 10cm SSD, clearly seen on KUB.  We had previously discussed options for the stone including surveillance, SWL, or ureteroscopy/laser lithotripsy/stent.  She had elected for SWL.   Apparently [facility] is out of her insurance network and she would have to pay out-of-pocket for SWL.  She wanted to discuss the risks and benefits of ureteroscopy today.  Assessment and Plan: We had a long conversation about the differences between SWL and ureteroscopy today.  We discussed the higher stone free rate with ureteroscopy, but the higher risk of postoperative stent related pain, UTI, and ER visits.  We also discussed surveillance is very reasonable option with her asymptomatic stone, however it would be unlikely to pass if it did move into the ureter with its 7 mm size.  She strongly prefers intervention for the stone prior to becoming symptomatic.  Follow Up: Will review cost of ureteroscopy with her insurance coverage so she can make a decision between SWL without  insurance coverage versus ureteroscopy with coverage.  Schedule LEFT SWL or ureteroscopy per patient preference in the near future   I discussed the assessment and treatment plan with the patient. The patient was provided an opportunity to ask questions and all were answered. The patient agreed with the plan and demonstrated an understanding of the instructions.   The patient was advised to call back or seek an in-person evaluation if the symptoms worsen or if the condition fails to improve as anticipated.  I provided 12 minutes of non-face-to-face time during this encounter.   Billey Co, MD

## 2018-10-11 ENCOUNTER — Encounter: Payer: Self-pay | Admitting: Radiology

## 2018-10-11 ENCOUNTER — Other Ambulatory Visit: Payer: Self-pay | Admitting: Radiology

## 2018-10-11 ENCOUNTER — Other Ambulatory Visit: Payer: 59

## 2018-10-11 DIAGNOSIS — N200 Calculus of kidney: Secondary | ICD-10-CM

## 2018-10-14 ENCOUNTER — Encounter: Admission: RE | Payer: Self-pay

## 2018-10-14 ENCOUNTER — Ambulatory Visit: Admission: RE | Admit: 2018-10-14 | Payer: Managed Care, Other (non HMO) | Admitting: Urology

## 2018-10-14 SURGERY — LITHOTRIPSY, ESWL
Anesthesia: Moderate Sedation | Laterality: Left

## 2018-10-27 ENCOUNTER — Telehealth: Payer: Self-pay

## 2018-10-28 ENCOUNTER — Ambulatory Visit: Payer: Managed Care, Other (non HMO) | Admitting: Urology

## 2018-11-01 ENCOUNTER — Telehealth: Payer: Self-pay | Admitting: Radiology

## 2018-11-01 ENCOUNTER — Other Ambulatory Visit: Payer: Managed Care, Other (non HMO)

## 2018-11-01 NOTE — Telephone Encounter (Signed)
Per pre-admission testing office, Pt stated that she wasnt having surgery as scheduled on 7/31 and refused the preadmit appt.  Called to discuss but vm is full and couldn't LMOM.

## 2018-11-02 ENCOUNTER — Other Ambulatory Visit: Admission: RE | Admit: 2018-11-02 | Payer: 59 | Source: Ambulatory Visit

## 2018-11-05 ENCOUNTER — Encounter: Admission: RE | Payer: Self-pay | Source: Home / Self Care

## 2018-11-05 ENCOUNTER — Ambulatory Visit: Admission: RE | Admit: 2018-11-05 | Payer: 59 | Source: Home / Self Care | Admitting: Urology

## 2018-11-05 SURGERY — CYSTOSCOPY/URETEROSCOPY/HOLMIUM LASER/STENT PLACEMENT
Anesthesia: Choice | Laterality: Left

## 2019-05-27 ENCOUNTER — Other Ambulatory Visit: Payer: Self-pay | Admitting: Family Medicine

## 2019-05-27 DIAGNOSIS — F411 Generalized anxiety disorder: Secondary | ICD-10-CM | POA: Insufficient documentation

## 2019-05-27 DIAGNOSIS — Z1231 Encounter for screening mammogram for malignant neoplasm of breast: Secondary | ICD-10-CM

## 2020-05-28 ENCOUNTER — Other Ambulatory Visit: Payer: Self-pay | Admitting: Family Medicine

## 2020-05-28 DIAGNOSIS — Z13220 Encounter for screening for lipoid disorders: Secondary | ICD-10-CM | POA: Diagnosis not present

## 2020-05-28 DIAGNOSIS — Z6834 Body mass index (BMI) 34.0-34.9, adult: Secondary | ICD-10-CM | POA: Diagnosis not present

## 2020-05-28 DIAGNOSIS — E6609 Other obesity due to excess calories: Secondary | ICD-10-CM | POA: Diagnosis not present

## 2020-05-28 DIAGNOSIS — R7303 Prediabetes: Secondary | ICD-10-CM | POA: Diagnosis not present

## 2020-05-28 DIAGNOSIS — F419 Anxiety disorder, unspecified: Secondary | ICD-10-CM | POA: Diagnosis not present

## 2020-05-28 DIAGNOSIS — Z0000 Encounter for general adult medical examination without abnormal findings: Secondary | ICD-10-CM | POA: Diagnosis not present

## 2020-05-28 DIAGNOSIS — F411 Generalized anxiety disorder: Secondary | ICD-10-CM | POA: Diagnosis not present

## 2020-05-28 DIAGNOSIS — R5383 Other fatigue: Secondary | ICD-10-CM | POA: Diagnosis not present

## 2020-05-28 DIAGNOSIS — K769 Liver disease, unspecified: Secondary | ICD-10-CM

## 2020-06-06 ENCOUNTER — Other Ambulatory Visit: Payer: Self-pay

## 2020-06-06 ENCOUNTER — Ambulatory Visit
Admission: RE | Admit: 2020-06-06 | Discharge: 2020-06-06 | Disposition: A | Discharge status: Home / Self Care | Payer: 59 | Source: Ambulatory Visit | Attending: Family Medicine | Admitting: Family Medicine

## 2020-06-06 DIAGNOSIS — K760 Fatty (change of) liver, not elsewhere classified: Secondary | ICD-10-CM | POA: Diagnosis not present

## 2020-06-06 DIAGNOSIS — K769 Liver disease, unspecified: Secondary | ICD-10-CM | POA: Insufficient documentation

## 2020-06-11 ENCOUNTER — Other Ambulatory Visit (HOSPITAL_COMMUNITY): Payer: Self-pay | Admitting: Family Medicine

## 2020-06-11 ENCOUNTER — Other Ambulatory Visit: Payer: Self-pay | Admitting: Family Medicine

## 2020-06-11 DIAGNOSIS — K7689 Other specified diseases of liver: Secondary | ICD-10-CM

## 2020-06-23 ENCOUNTER — Ambulatory Visit
Admission: RE | Admit: 2020-06-23 | Discharge: 2020-06-23 | Disposition: A | Discharge status: Home / Self Care | Payer: 59 | Source: Ambulatory Visit | Attending: Family Medicine | Admitting: Family Medicine

## 2020-06-23 ENCOUNTER — Other Ambulatory Visit: Payer: Self-pay

## 2020-06-23 DIAGNOSIS — N281 Cyst of kidney, acquired: Secondary | ICD-10-CM | POA: Diagnosis not present

## 2020-06-23 DIAGNOSIS — K7689 Other specified diseases of liver: Secondary | ICD-10-CM | POA: Diagnosis not present

## 2020-06-23 DIAGNOSIS — K5750 Diverticulosis of both small and large intestine without perforation or abscess without bleeding: Secondary | ICD-10-CM | POA: Diagnosis not present

## 2020-06-23 DIAGNOSIS — K449 Diaphragmatic hernia without obstruction or gangrene: Secondary | ICD-10-CM | POA: Diagnosis not present

## 2020-06-23 MED ORDER — GADOBUTROL 1 MMOL/ML IV SOLN
10.0000 mL | Freq: Once | INTRAVENOUS | Status: AC | PRN
  Administered 2020-06-23: 10 mL via INTRAVENOUS

## 2020-07-09 DIAGNOSIS — E669 Obesity, unspecified: Secondary | ICD-10-CM | POA: Diagnosis not present

## 2020-07-09 DIAGNOSIS — T700XXA Otitic barotrauma, initial encounter: Secondary | ICD-10-CM | POA: Diagnosis not present

## 2020-07-30 IMAGING — CT CT ABD-PEL WO/W CM
3 of 12 series · 12 of 46 positions shown, 18 images · IV contrast (omnipaque)
Comparison: Abdominal ultrasound of 12/21/2008

CLINICAL DATA: Microscopic hematuria.

EXAM:
CT ABDOMEN AND PELVIS WITHOUT AND WITH CONTRAST
TECHNIQUE: Multidetector CT imaging of the abdomen and pelvis was performed
following the standard protocol before and following the bolus
administration of intravenous contrast.
CONTRAST:  125mL OMNIPAQUE IOHEXOL 300 MG/ML  SOLN

[Series 2: without pre · axial · non-contrast · 0.84mm/px · z∈[-1562,-1422]mm · 3 of 100 slices shown]
[im 15/100  soft-tissue]
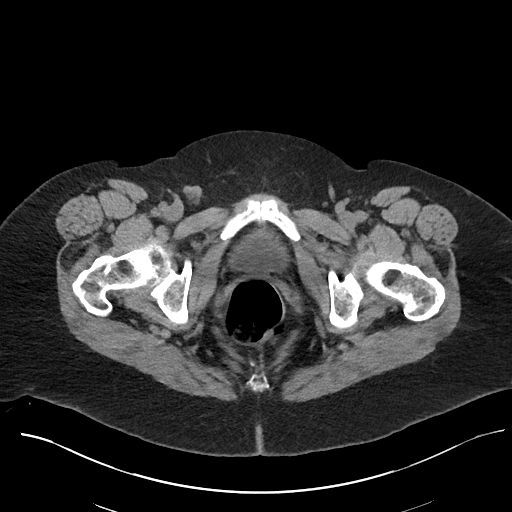
[im 29/100  soft-tissue]
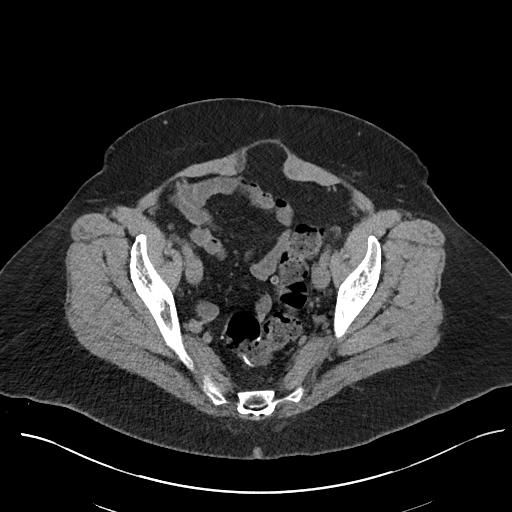
[im 43/100  soft-tissue]
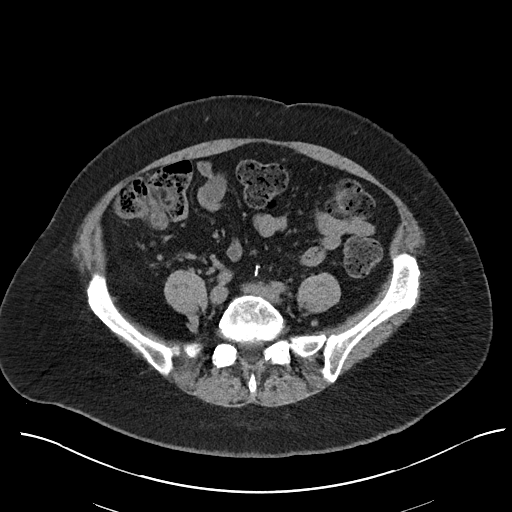

[Series 5: cor without without pre · coronal · non-contrast · 0.84mm/px · 2 of 160 slices shown, 3 images]
[im 54/160  soft-tissue]
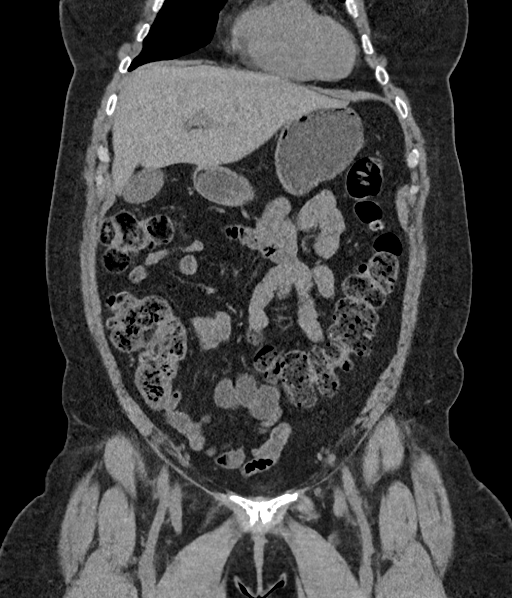
[im 54/160  bone]
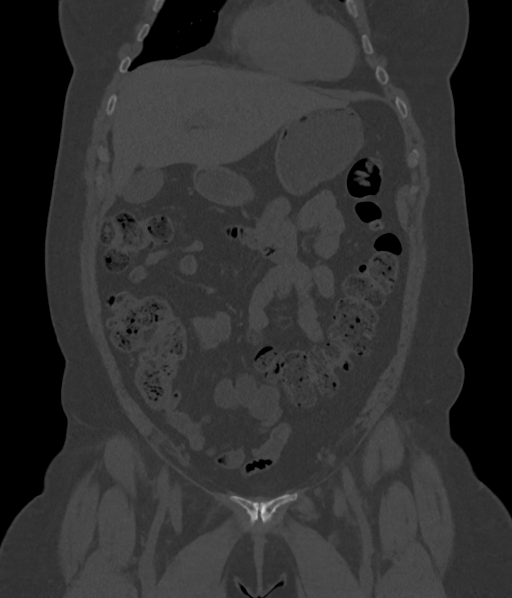
[im 107/160  soft-tissue]
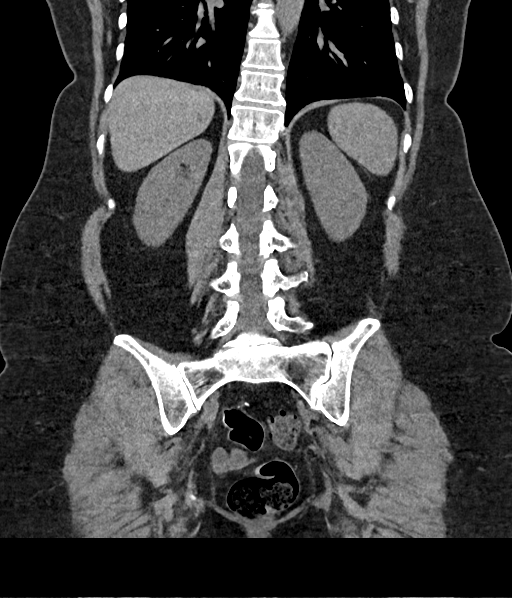

[Series 17: axial delay delay prone · axial · delayed · 0.83mm/px · z∈[-1465,-1090]mm · 7 of 101 slices shown, 12 images]
[im 13/101  soft-tissue]
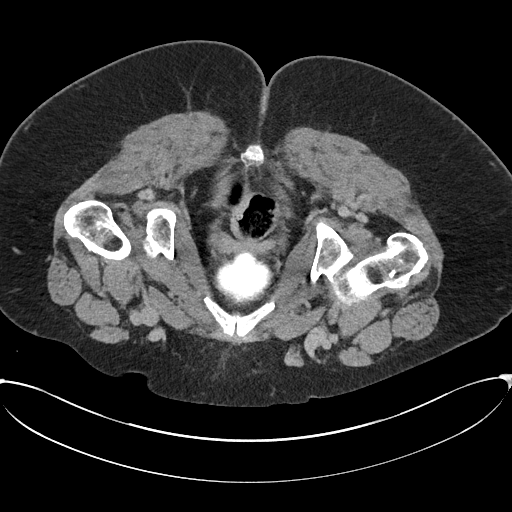
[im 13/101  bone]
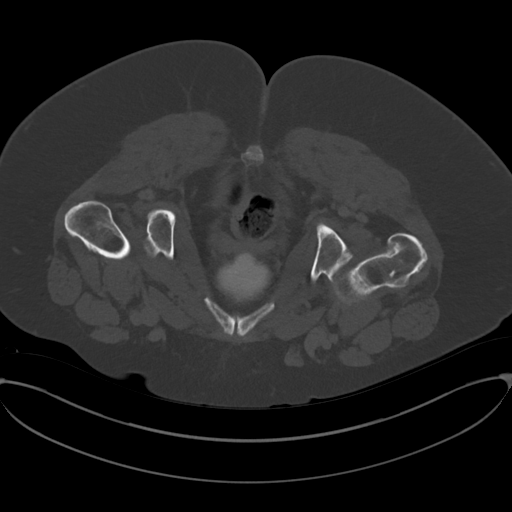
[im 26/101  soft-tissue]
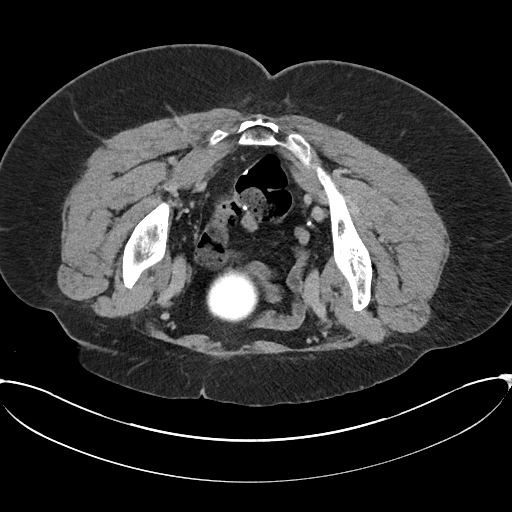
[im 38/101  soft-tissue]
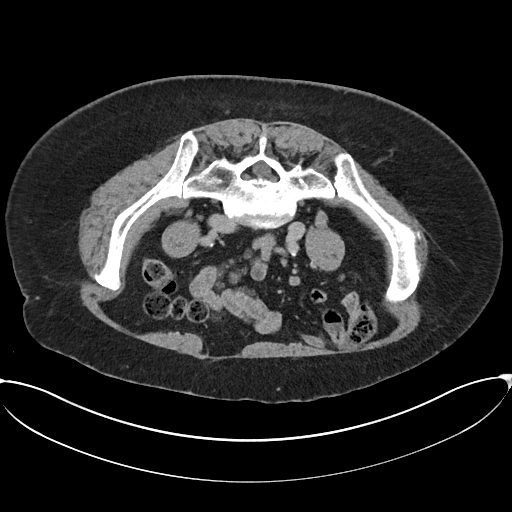
[im 51/101  soft-tissue]
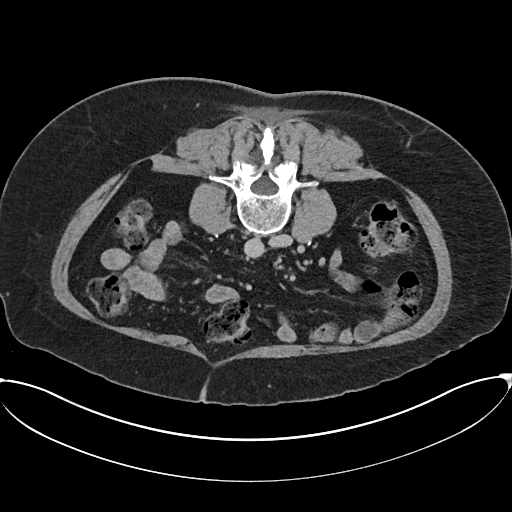
[im 51/101  lung]
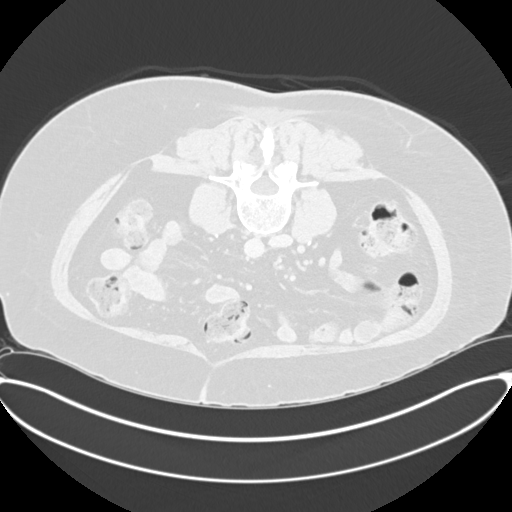
[im 63/101  soft-tissue]
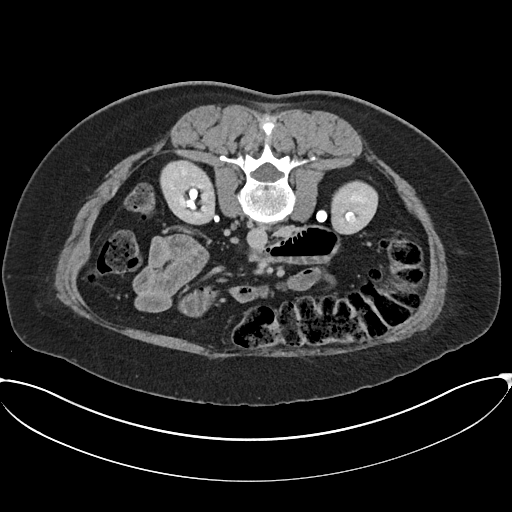
[im 63/101  lung]
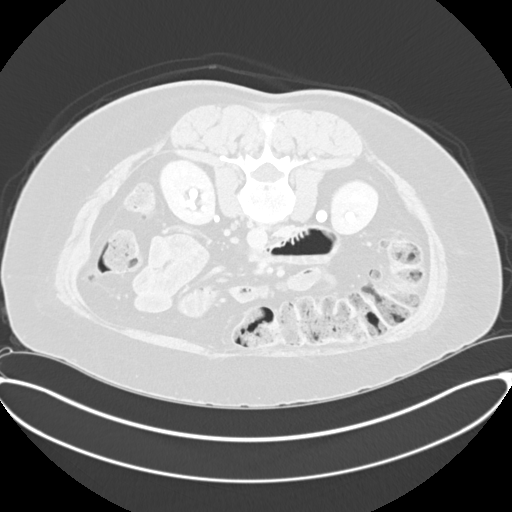
[im 76/101  soft-tissue]
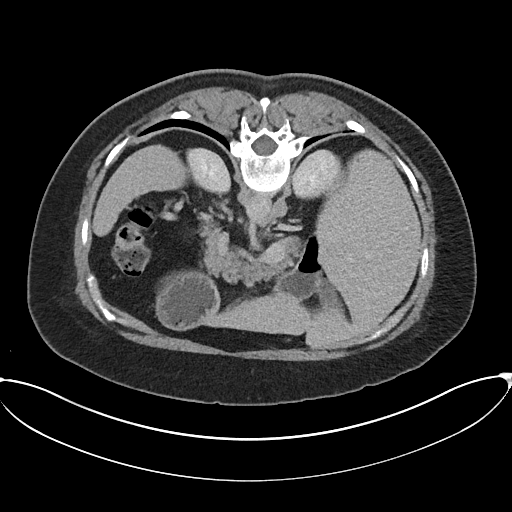
[im 76/101  lung]
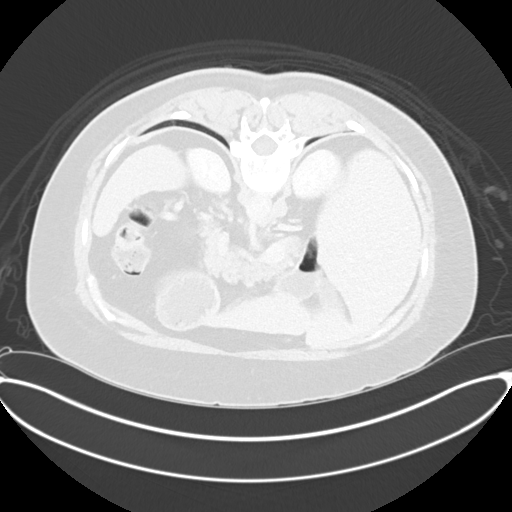
[im 88/101  soft-tissue]
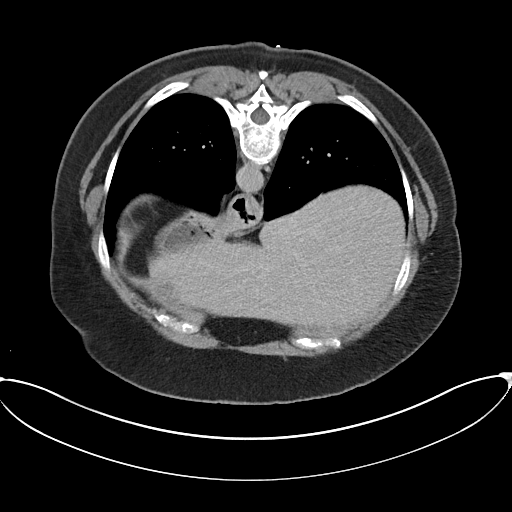
[im 88/101  lung]
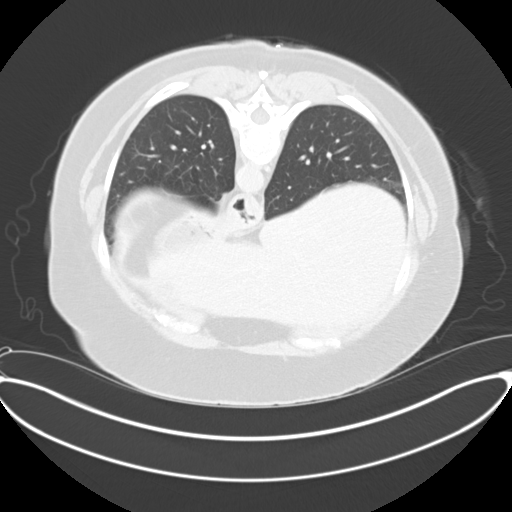

[12 of 46 positions shown; findings below may reference images not displayed]

FINDINGS: Lower chest: Small type 1 hiatal hernia.

Hepatobiliary: Suspected 5 mm hypodense right hepatic lobe lesion on
image [DATE] and suspected 4 mm hypodense lesion in the right hepatic
lobe on image [DATE]. These lesions are technically too small to
characterize. Gallbladder unremarkable. No biliary dilatation.

Pancreas: Unremarkable

Spleen: Unremarkable

Adrenals/Urinary Tract: Both adrenal glands appear normal.

5 mm right mid kidney nonobstructive renal calculus, image 99/5. 7
mm nonobstructive left kidney lower pole calculus, image 98/5.

3 mm left kidney lower pole nonobstructive calculus, image 103/5. No
additional urinary tract calculi. No hydronephrosis or hydroureter.
No abnormal renal parenchymal enhancement. No abnormal enhancement
or additional filling defect along the urothelium.

Stomach/Bowel: Postoperative findings in the rectum.

Vascular/Lymphatic: Unremarkable

Reproductive: Uterus absent.  Adnexa unremarkable.

Other: No supplemental non-categorized findings.

Musculoskeletal: Loss of disc height at L5-S1 with mild endplate
sclerosis borderline left foraminal narrowing due to facet and
intervertebral spurring.
IMPRESSION: 1. Bilateral nonobstructive nephrolithiasis.
2. Two tiny hypodense lesions in the right hepatic lobe are
technically nonspecific although statistically likely to be benign.
Postoperative findings in the rectum are reportedly related to prior
endometriosis rather than bowel malignancy, and accordingly further
workup of the potential liver lesions is considered optional.
3. Small type 1 hiatal hernia.
4. Degenerative disc disease and spondylosis at L5-S1.

## 2020-10-09 ENCOUNTER — Other Ambulatory Visit: Payer: Self-pay

## 2020-10-09 MED ORDER — ESCITALOPRAM OXALATE 10 MG PO TABS
10.0000 mg | ORAL_TABLET | Freq: Every day | ORAL | 10 refills | Status: DC
  Filled 2020-10-09: qty 30, 30d supply, fill #0
  Filled 2020-11-01: qty 30, 30d supply, fill #1

## 2020-10-09 MED ORDER — SEMAGLUTIDE-WEIGHT MANAGEMENT 0.25 MG/0.5ML ~~LOC~~ SOAJ: 0.2500 mg | SUBCUTANEOUS | 0 refills | Status: DC

## 2020-10-09 MED ORDER — PHENTERMINE HCL 37.5 MG PO TABS
37.5000 mg | ORAL_TABLET | Freq: Every day | ORAL | 1 refills | Status: DC
  Filled 2020-10-09: qty 30, 30d supply, fill #0

## 2020-10-29 ENCOUNTER — Other Ambulatory Visit: Payer: Self-pay

## 2020-10-29 DIAGNOSIS — Z6834 Body mass index (BMI) 34.0-34.9, adult: Secondary | ICD-10-CM | POA: Diagnosis not present

## 2020-10-29 DIAGNOSIS — M7061 Trochanteric bursitis, right hip: Secondary | ICD-10-CM | POA: Diagnosis not present

## 2020-10-29 DIAGNOSIS — E6609 Other obesity due to excess calories: Secondary | ICD-10-CM | POA: Diagnosis not present

## 2020-10-29 DIAGNOSIS — F411 Generalized anxiety disorder: Secondary | ICD-10-CM | POA: Diagnosis not present

## 2020-10-29 MED ORDER — PREDNISONE 20 MG PO TABS
ORAL_TABLET | ORAL | 0 refills | Status: DC
  Filled 2020-10-29: qty 9, 6d supply, fill #0

## 2020-10-29 MED ORDER — SAXENDA 18 MG/3ML ~~LOC~~ SOPN
PEN_INJECTOR | SUBCUTANEOUS | 0 refills | Status: DC
  Filled 2020-10-29 – 2020-11-16 (×5): qty 15, 30d supply, fill #0

## 2020-10-31 ENCOUNTER — Other Ambulatory Visit: Payer: Self-pay

## 2020-10-31 ENCOUNTER — Ambulatory Visit: Payer: 59 | Admitting: Dermatology

## 2020-10-31 ENCOUNTER — Encounter: Payer: Self-pay | Admitting: Dermatology

## 2020-10-31 DIAGNOSIS — L820 Inflamed seborrheic keratosis: Secondary | ICD-10-CM | POA: Diagnosis not present

## 2020-10-31 DIAGNOSIS — Z1283 Encounter for screening for malignant neoplasm of skin: Secondary | ICD-10-CM | POA: Diagnosis not present

## 2020-10-31 DIAGNOSIS — M67442 Ganglion, left hand: Secondary | ICD-10-CM

## 2020-10-31 DIAGNOSIS — L570 Actinic keratosis: Secondary | ICD-10-CM

## 2020-10-31 DIAGNOSIS — D485 Neoplasm of uncertain behavior of skin: Secondary | ICD-10-CM | POA: Diagnosis not present

## 2020-10-31 DIAGNOSIS — D1800 Hemangioma unspecified site: Secondary | ICD-10-CM

## 2020-10-31 DIAGNOSIS — L814 Other melanin hyperpigmentation: Secondary | ICD-10-CM | POA: Diagnosis not present

## 2020-10-31 DIAGNOSIS — Z86018 Personal history of other benign neoplasm: Secondary | ICD-10-CM

## 2020-10-31 DIAGNOSIS — B078 Other viral warts: Secondary | ICD-10-CM | POA: Diagnosis not present

## 2020-10-31 DIAGNOSIS — D225 Melanocytic nevi of trunk: Secondary | ICD-10-CM | POA: Diagnosis not present

## 2020-10-31 DIAGNOSIS — M67449 Ganglion, unspecified hand: Secondary | ICD-10-CM

## 2020-10-31 DIAGNOSIS — L578 Other skin changes due to chronic exposure to nonionizing radiation: Secondary | ICD-10-CM

## 2020-10-31 DIAGNOSIS — L280 Lichen simplex chronicus: Secondary | ICD-10-CM

## 2020-10-31 DIAGNOSIS — L821 Other seborrheic keratosis: Secondary | ICD-10-CM

## 2020-10-31 DIAGNOSIS — D229 Melanocytic nevi, unspecified: Secondary | ICD-10-CM

## 2020-10-31 DIAGNOSIS — D492 Neoplasm of unspecified behavior of bone, soft tissue, and skin: Secondary | ICD-10-CM

## 2020-10-31 MED ORDER — TRIAMCINOLONE ACETONIDE 0.1 % EX OINT
TOPICAL_OINTMENT | CUTANEOUS | 2 refills | Status: DC
  Filled 2020-10-31: qty 15, 30d supply, fill #0

## 2020-10-31 NOTE — Patient Instructions (Addendum)
Cryotherapy  Cryotherapy is the treatment of lesions with the application of a cold substance.  In most cases, liquid nitrogen is used to destroy the lesion(s).  Liquid nitrogen is so cold, -196 Celsius, it feels like it is burning when it is applied.  After treatment with liquid nitrogen, there may be some burning sensation or pain that can last up to 24 hours.  The area may also be swollen and red.  The discomfort can be relieved with ibuprofen (Advil, Motrin), acetaminophen (Extra Strength Tylenol), or similar pain relief medication.  Within 24 to 48 hours, a blister may form.  Occasionally, these blisters will become filled with blood and become very dark.  This is no cause for concern.  The blister will gradually dry up over a period of several days, eventually separating from the healing skin below in about one to two weeks.  The surrounding skin will become red and the area may become itchy.  This is all part of the normal healing process.  Occasionally, the crusts will last as long as four weeks when certain deeper spots on the skin are treated.  Sometimes a permanent white mark or scar will be left after healing.  You may continue all of your normal activities as long as they do not cause pain in the treated areas.  It is okay to get the area wet.  After therapy or if the blister is still intact - You may treat it like normal skin.  Covering the area with a bandage may offer some comfort and protection from trauma but is not absolutely necessary.  If the blister is uncomfortable - Clean a small needle with rubbing alcohol, then gently make a small hole in the side of the blister to drain the blister fluid.  This often gives immediate relief.  Do not remove the blister roof, as the blister aids in healing.  In time it will fall off on its own.   Once the blister roof falls off or a sore forms -  Clean the blister sites with soap and water.  Rinse the area and pat dry.  Do not force off the  blister roof or crust. Apply an antibiotic ointment such as Polysporin or Bacitracin. A bandage may be applied loosely over the blister until it is healed if desired. Call our office if you are concerned it may be infected.  Some redness, itching and oozing is part of the normal healing process.  Signs of infection include increasing redness, increasing pain, swelling, heat, or yellow discharge. Wound Care Instructions  Cleanse wound gently with soap and water once a day then pat dry with clean gauze. Apply a thing coat of Petrolatum (petroleum jelly, "Vaseline") over the wound (unless you have an allergy to this). We recommend that you use a new, sterile tube of Vaseline. Do not pick or remove scabs. Do not remove the yellow or white "healing tissue" from the base of the wound.  Cover the wound with fresh, clean, nonstick gauze and secure with paper tape. You may use Band-Aids in place of gauze and tape if the would is small enough, but would recommend trimming much of the tape off as there is often too much. Sometimes Band-Aids can irritate the skin.  You should call the office for your biopsy report after 1 week if you have not already been contacted.  If you experience any problems, such as abnormal amounts of bleeding, swelling, significant bruising, significant pain, or evidence of infection, please  call the office immediately.  FOR ADULT SURGERY PATIENTS: If you need something for pain relief you may take 1 extra strength Tylenol (acetaminophen) AND 2 Ibuprofen ('200mg'$  each) together every 4 hours as needed for pain. (do not take these if you are allergic to them or if you have a reason you should not take them.) Typically, you may only need pain medication for 1 to 3 days.    Seborrheic Keratosis  What causes seborrheic keratoses? Seborrheic keratoses are harmless, common skin growths that first appear during adult life.  As time goes by, more growths appear.  Some people may develop a  large number of them.  Seborrheic keratoses appear on both covered and uncovered body parts.  They are not caused by sunlight.  The tendency to develop seborrheic keratoses can be inherited.  They vary in color from skin-colored to gray, brown, or even black.  They can be either smooth or have a rough, warty surface.   Seborrheic keratoses are superficial and look as if they were stuck on the skin.  Under the microscope this type of keratosis looks like layers upon layers of skin.  That is why at times the top layer may seem to fall off, but the rest of the growth remains and re-grows.    Treatment Seborrheic keratoses do not need to be treated, but can easily be removed in the office.  Seborrheic keratoses often cause symptoms when they rub on clothing or jewelry.  Lesions can be in the way of shaving.  If they become inflamed, they can cause itching, soreness, or burning.  Removal of a seborrheic keratosis can be accomplished by freezing, burning, or surgery. If any spot bleeds, scabs, or grows rapidly, please return to have it checked, as these can be an indication of a skin cancer.  Recommend using [place] Rapid relief lotion for itching on back.   Topical steroids (such as triamcinolone, fluocinolone, fluocinonide, mometasone, clobetasol, halobetasol, betamethasone, hydrocortisone) can cause thinning and lightening of the skin if they are used for too long in the same area. Your physician has selected the right strength medicine for your problem and area affected on the body. Please use your medication only as directed by your physician to prevent side effects.

## 2020-10-31 NOTE — Progress Notes (Signed)
New Patient Visit  Subjective  Margaret Sellers is a 60 y.o. female who presents for the following: Annual Exam (Skin cancer screening. HxDN at upper mid back, 2010. ) and Seborrheic Keratosis (ISK's at back. Would like some removed today. Itching constantly. ).  Husband with patient.  Objective  Well appearing patient in no apparent distress; mood and affect are within normal limits.  Review of Systems: No other skin or systemic complaints except as noted in HPI or Assessment and Plan.   A full examination was performed including scalp, head, eyes, ears, nose, lips, neck, chest, axillae, abdomen, back, buttocks, bilateral upper extremities, bilateral lower extremities, hands, feet, fingers, toes, fingernails, and toenails. All findings within normal limits unless otherwise noted below.  Right Antecubital  fossa x1, left nasal tip (2) Erythematous thin papules/macules with gritty scale.   Right Upper Arm x1, left upper arm x1, left axilla x4, left areola x1, right back x8, upper mid back x1 Erythematous keratotic or waxy stuck-on papule or plaque.   Right Abdomen 0.5cm irregular dark brown papule     Pubic Lichenified plaque  Right Knee - medial x1, right lower back x1 (2) Verrucous papules -- Discussed viral etiology and contagion.   Left 2nd Finger DIP x1 Rough skin colored papule overlying joint  right lower back Brown papule  Assessment & Plan  AK (actinic keratosis) (2) Right Antecubital  fossa x1, left nasal tip  Prior to procedure, discussed risks of blister formation, small wound, skin dyspigmentation, or rare scar following cryotherapy. Recommend Vaseline ointment to treated areas while healing.   Destruction of lesion - Right Antecubital  fossa x1, left nasal tip  Destruction method: cryotherapy   Informed consent: discussed and consent obtained   Lesion destroyed using liquid nitrogen: Yes   Cryotherapy cycles:  2 Outcome: patient tolerated procedure  well with no complications   Post-procedure details: wound care instructions given    Inflamed seborrheic keratosis Right Upper Arm x1, left upper arm x1, left axilla x4, left areola x1, right back x8, upper mid back x1  Prior to procedure, discussed risks of blister formation, small wound, skin dyspigmentation, or rare scar following cryotherapy. Recommend Vaseline ointment to treated areas while healing.   Destruction of lesion - Right Upper Arm x1, left upper arm x1, left axilla x4, left areola x1, right back x8, upper mid back x1  Destruction method: cryotherapy   Informed consent: discussed and consent obtained   Lesion destroyed using liquid nitrogen: Yes   Region frozen until ice ball extended beyond lesion: Yes   Outcome: patient tolerated procedure well with no complications   Post-procedure details: wound care instructions given    Neoplasm of skin Right Abdomen  Epidermal / dermal shaving  Lesion diameter (cm):  0.5 Informed consent: discussed and consent obtained   Timeout: patient name, date of birth, surgical site, and procedure verified   Anesthesia: the lesion was anesthetized in a standard fashion   Anesthetic:  1% lidocaine w/ epinephrine 1-100,000 local infiltration Instrument used: flexible razor blade   Hemostasis achieved with: aluminum chloride   Outcome: patient tolerated procedure well   Post-procedure details: wound care instructions given   Additional details:  Mupirocin and a bandage applied  Specimen 1 - Surgical pathology Differential Diagnosis: R/O atypia 0.5cm irregular dark brown papule Check Margins: No  Lichen simplex chronicus Pubic  Avoid rubbing/scratching  Start Triamcinolone 0.1% oint BID PRN itching. Avoid applying to face, groin, and axilla. Use as directed. Risk  of skin atrophy with long-term use reviewed.   Topical steroids (such as triamcinolone, fluocinolone, fluocinonide, mometasone, clobetasol, halobetasol, betamethasone,  hydrocortisone) can cause thinning and lightening of the skin if they are used for too long in the same area. Your physician has selected the right strength medicine for your problem and area affected on the body. Please use your medication only as directed by your physician to prevent side effects.   Recheck 6 weeks.  triamcinolone ointment (KENALOG) 0.1 % - Pubic BID PRN itching  Other viral warts (2) Right Knee - medial x1, right lower back x1  Discussed viral etiology and risk of spread.  Discussed multiple treatments may be required to clear warts.  Discussed possible post-treatment dyspigmentation and risk of recurrence.  Digital mucinous cyst Left 2nd Finger DIP x1  Vs verruca  Prior to procedure, discussed risks of blister formation, small wound, skin dyspigmentation, or rare scar following cryotherapy. Recommend Vaseline ointment to treated areas while healing.  Reviewed that if this is a digital mucous cyst rather than verruca it will likely not resolve  Destruction of lesion - Left 2nd Finger DIP x1  Destruction method: cryotherapy   Informed consent: discussed and consent obtained   Lesion destroyed using liquid nitrogen: Yes   Outcome: patient tolerated procedure well with no complications   Post-procedure details: wound care instructions given    Neoplasm of uncertain behavior of skin right lower back  Symptomatic Plan shave removal at next visit.   Lentigines - Scattered tan macules - Due to sun exposure - Benign-appering, observe - Recommend daily broad spectrum sunscreen SPF 30+ to sun-exposed areas, reapply every 2 hours as needed. - Call for any changes  Seborrheic Keratoses - Stuck-on, waxy, tan-brown papules and/or plaques  - Benign-appearing - Discussed benign etiology and prognosis. - Observe - Call for any changes  Melanocytic Nevi - Tan-brown and/or pink-flesh-colored symmetric macules and papules - Benign appearing on exam today -  Observation - Call clinic for new or changing moles - Recommend daily use of broad spectrum spf 30+ sunscreen to sun-exposed areas.   Hemangiomas - Red papules - Discussed benign nature - Observe - Call for any changes  Actinic Damage - Chronic condition, secondary to cumulative UV/sun exposure - diffuse scaly erythematous macules with underlying dyspigmentation - Recommend daily broad spectrum sunscreen SPF 30+ to sun-exposed areas, reapply every 2 hours as needed.  - Staying in the shade or wearing long sleeves, sun glasses (UVA+UVB protection) and wide brim hats (4-inch brim around the entire circumference of the hat) are also recommended for sun protection.  - Call for new or changing lesions.  Skin cancer screening performed today.  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  - Call if any new or changing lesions are noted between office visits   Return for recheck 6 weeks ISKs.  I, Emelia Salisbury, CMA, am acting as scribe for Forest Gleason, MD.   Documentation: I have reviewed the above documentation for accuracy and completeness, and I agree with the above.  Forest Gleason, MD

## 2020-11-01 ENCOUNTER — Other Ambulatory Visit: Payer: Self-pay

## 2020-11-02 ENCOUNTER — Other Ambulatory Visit: Payer: Self-pay

## 2020-11-07 ENCOUNTER — Other Ambulatory Visit: Payer: Self-pay

## 2020-11-08 ENCOUNTER — Telehealth: Payer: Self-pay

## 2020-11-08 NOTE — Telephone Encounter (Signed)
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 On the spectrum from normal mole to melanoma skin cancer, this is in between the two but closer towards a melanoma skin cancer.  - The treatment of choice for severely atypical moles is to cut them out in clinic with an area of normal looking skin around them to get all the atypical cells out. The skin that is removed will be sent to check under the microscope again to be sure it looks completely out.   - People who have a history of atypical moles do have a slightly increased risk of developing melanoma somewhere on the body, so a full body skin exam by a dermatologist is recommended at least once a year. - Monthly self skin checks and daily sun protection are also recommended.  - Please also call if you notice any new or changing spots anywhere else on the body before your follow-up visit.   MAs please call. Thank you!

## 2020-11-09 ENCOUNTER — Other Ambulatory Visit: Payer: Self-pay

## 2020-11-11 ENCOUNTER — Encounter: Payer: Self-pay | Admitting: Dermatology

## 2020-11-16 ENCOUNTER — Other Ambulatory Visit: Payer: Self-pay

## 2020-11-16 MED ORDER — UNIFINE PENTIPS 31G X 5 MM MISC
0 refills | Status: DC
  Filled 2020-11-16: qty 100, 90d supply, fill #0

## 2020-11-21 ENCOUNTER — Other Ambulatory Visit: Payer: Self-pay

## 2020-11-28 ENCOUNTER — Telehealth: Payer: Self-pay

## 2020-11-28 NOTE — Telephone Encounter (Signed)
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 On the spectrum from normal mole to melanoma skin cancer, this is in between the two but closer towards a melanoma skin cancer.  - The treatment of choice for severely atypical moles is to cut them out in clinic with an area of normal looking skin around them to get all the atypical cells out. The skin that is removed will be sent to check under the microscope again to be sure it looks completely out.   - People who have a history of atypical moles do have a slightly increased risk of developing melanoma somewhere on the body, so a full body skin exam by a dermatologist is recommended at least once a year. - Monthly self skin checks and daily sun protection are also recommended.  - Please also call if you notice any new or changing spots anywhere else on the body before your follow-up visit.   MAs please call. Thank you!

## 2020-11-28 NOTE — Telephone Encounter (Addendum)
Patient notified of results. Patient states she would like talk to provider at next follow up before scheduling removal. She denied questions at this time.   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 On the spectrum from normal mole to melanoma skin cancer, this is in between the two but closer towards a melanoma skin cancer.  - The treatment of choice for severely atypical moles is to cut them out in clinic with an area of normal looking skin around them to get all the atypical cells out. The skin that is removed will be sent to check under the microscope again to be sure it looks completely out.   - People who have a history of atypical moles do have a slightly increased risk of developing melanoma somewhere on the body, so a full body skin exam by a dermatologist is recommended at least once a year. - Monthly self skin checks and daily sun protection are also recommended.  - Please also call if you notice any new or changing spots anywhere else on the body before your follow-up visit.   MAs please call. Thank you!

## 2020-11-28 NOTE — Telephone Encounter (Signed)
Patient scheduled for excision 12/26/20 DYSPLASTIC COMPOUND NEVUS WITH SEVERE ATYPIA at right abdomen.Cherly Hensen

## 2020-11-28 NOTE — Telephone Encounter (Signed)
Reviewed results and recommendation with patient.  MAs please call to schedule excision. Thank you!

## 2020-12-06 ENCOUNTER — Ambulatory Visit: Payer: 59 | Admitting: Dermatology

## 2020-12-06 ENCOUNTER — Other Ambulatory Visit: Payer: Self-pay

## 2020-12-06 DIAGNOSIS — L820 Inflamed seborrheic keratosis: Secondary | ICD-10-CM | POA: Diagnosis not present

## 2020-12-06 DIAGNOSIS — M67442 Ganglion, left hand: Secondary | ICD-10-CM

## 2020-12-06 DIAGNOSIS — B078 Other viral warts: Secondary | ICD-10-CM

## 2020-12-06 DIAGNOSIS — D225 Melanocytic nevi of trunk: Secondary | ICD-10-CM | POA: Diagnosis not present

## 2020-12-06 DIAGNOSIS — M67449 Ganglion, unspecified hand: Secondary | ICD-10-CM

## 2020-12-06 DIAGNOSIS — L570 Actinic keratosis: Secondary | ICD-10-CM

## 2020-12-06 NOTE — Patient Instructions (Addendum)
Cantharidin Plus is a blistering agent that comes from a beetle.  It needs to be washed off in about 4 hours after application.  Although it is painless when applied in office, it may cause symptoms of mild pain and burning several hours later.  Treated areas will swell and turn red, and blisters may form.  Vaseline and a bandaid may be applied until wound has healed.  Once healed, the skin may remain temporarily discolored.  It can take weeks to months for pigmentation to return to normal.  Advised to wash off with soap and water in 4 hours or sooner if it becomes tender before then.  Instructions for After In-Office Application of Cantharidin  1. This is a strong medicine; please follow ALL instructions.  2. Gently wash off with soap and water in four hours or sooner s directed by your physician.  3. **WARNING** this medicine can cause severe blistering, blood blisters, infection, and/or scarring if it is not washed off as directed.  4. Your progress will be rechecked in 1-2 months; call sooner if there are any questions or problems.  Cryotherapy Aftercare  Wash gently with soap and water everyday.   Apply Vaseline and Band-Aid daily until healed.   Prior to procedure, discussed risks of blister formation, small wound, skin dyspigmentation, or rare scar following cryotherapy. Recommend Vaseline ointment to treated areas while healing.  Recommend daily broad spectrum sunscreen SPF 30+ to sun-exposed areas, reapply every 2 hours as needed. Call for new or changing lesions.  Staying in the shade or wearing long sleeves, sun glasses (UVA+UVB protection) and wide brim hats (4-inch brim around the entire circumference of the hat) are also recommended for sun protection.   If you have any questions or concerns for your doctor, please call our main line at [phone] and press option 4 to reach your doctor's medical assistant. If no one answers, please leave a voicemail as directed and we will  return your call as soon as possible. Messages left after 4 pm will be answered the following business day.   You may also send [place] a message via [place]. We typically respond to MyChart messages within 1-2 business days.  For prescription refills, please ask your pharmacy to contact our office. Our fax number is [phone].  If you have an urgent issue when the clinic is closed that cannot wait until the next business day, you can page your doctor at the number below.    Please note that while we do our best to be available for urgent issues outside of office hours, we are not available 24/7.   If you have an urgent issue and are unable to reach [place], you may choose to seek medical care at your doctor's office, retail clinic, urgent care center, or emergency room.  If you have a medical emergency, please immediately call 911 or go to the emergency department.  Pager Numbers  - Dr. Nehemiah Massed: [phone]  - Dr. Laurence Ferrari: [phone]  - Dr. Nicole Kindred: [phone]  In the event of inclement weather, please call our main line at [phone] for an update on the status of any delays or closures.  Dermatology Medication Tips: Please keep the boxes that topical medications come in in order to help keep track of the instructions about where and how to use these. Pharmacies typically print the medication instructions only on the boxes and not directly on the medication tubes.   If your medication is too expensive, please contact our office at [phone]  option 4 or send [place] a message through [place].   We are unable to tell what your co-pay for medications will be in advance as this is different depending on your insurance coverage. However, we may be able to find a substitute medication at lower cost or fill out paperwork to get insurance to cover a needed medication.   If a prior authorization is required to get your medication covered by your insurance company, please allow [place] 1-2 business  days to complete this process.  Drug prices often vary depending on where the prescription is filled and some pharmacies may offer cheaper prices.  The website www.goodrx.com contains coupons for medications through different pharmacies. The prices here do not account for what the cost may be with help from insurance (it may be cheaper with your insurance), but the website can give you the price if you did not use any insurance.  - You can print the associated coupon and take it with your prescription to the pharmacy.  - You may also stop by our office during regular business hours and pick up a GoodRx coupon card.  - If you need your prescription sent electronically to a different pharmacy, notify our office through [facility] or by phone at [phone] option 4.

## 2020-12-06 NOTE — Progress Notes (Signed)
Follow-Up Visit   Subjective  Margaret Sellers is a 60 y.o. female who presents for the following: Follow-up (Patient here today for AK follow up at Right Antecubital  fossa and left nasal tip, ISK follow up at Right Upper Arm, left upper arm, left axilla, left areola, right back and upper mid back. Patient also had warts at right knee, right lower back and digitial mucous cyst at left 2nd finger treated with LN2 at last visit but not resolved. Also here for possible shave removal at back. ).  Patient accompanied by husband.   The following portions of the chart were reviewed this encounter and updated as appropriate:   Tobacco  Allergies  Meds  Problems  Med Hx  Surg Hx  Fam Hx      Review of Systems:  No other skin or systemic complaints except as noted in HPI or Assessment and Plan.  Objective  Well appearing patient in no apparent distress; mood and affect are within normal limits.  A focused examination was performed including hands, arms, right knee, face, back, right inframammary. Relevant physical exam findings are noted in the Assessment and Plan.  Right Knee Verrucous papules -- Discussed viral etiology and contagion.   Left 2nd finger Solitary, slightly verrucous skin colored papule over DIP joint  nasal supratip x 1, right antecubital fossa x 1 (2) Erythematous thin papules/macules with gritty scale.   Right Inframammary x 1, upper mid back x 1, left lower back x 1, right lower back x 2 Erythematous keratotic or waxy stuck-on papule or plaque.    Assessment & Plan  Other viral warts Right Knee  Discussed viral etiology and risk of spread.  Discussed multiple treatments may be required to clear warts.  Discussed possible post-treatment dyspigmentation and risk of recurrence.  Squaric Acid 3% applied to warts today. Prior to application reviewed risk of inflammation and irritation.  Cantharidin Plus is a blistering agent that comes from a beetle.  It needs to  be washed off in about 4 hours after application.  Although it is painless when applied in office, it may cause symptoms of mild pain and burning several hours later.  Treated areas will swell and turn red, and blisters may form.  Vaseline and a bandaid may be applied until wound has healed.  Once healed, the skin may remain temporarily discolored.  It can take weeks to months for pigmentation to return to normal.  Advised to wash off with soap and water in 4 hours or sooner if it becomes tender before then.    Destruction of lesion - Right Knee  Destruction method: chemical removal   Informed consent: discussed and consent obtained   Timeout:  patient name, date of birth, surgical site, and procedure verified Chemical destruction method: cantharidin   Application time:  4 hours Procedure instructions: patient instructed to wash and dry area   Outcome: patient tolerated procedure well with no complications   Post-procedure details: wound care instructions given    Digital mucous cyst Left 2nd finger  Favored over verruca  Symptomatic  Prior to procedure, discussed risks of blister formation, small wound, skin dyspigmentation, or rare scar following cryotherapy. Recommend Vaseline ointment to treated areas while healing.  Reviewed if this is a digital mucous cyst, may clear with cryotherapy but most often requires hand surgery for definitive treatment  Destruction of lesion - Left 2nd finger  Destruction method: cryotherapy   Informed consent: discussed and consent obtained   Lesion destroyed  using liquid nitrogen: Yes   Cryotherapy cycles:  2 Outcome: patient tolerated procedure well with no complications   Post-procedure details: wound care instructions given    AK (actinic keratosis) (2) nasal supratip x 1, right antecubital fossa x 1  Prior to procedure, discussed risks of blister formation, small wound, skin dyspigmentation, or rare scar following cryotherapy. Recommend  Vaseline ointment to treated areas while healing.  If right antecubital fossa not clearing consider biopsy.   Destruction of lesion - nasal supratip x 1, right antecubital fossa x 1  Destruction method: cryotherapy   Informed consent: discussed and consent obtained   Lesion destroyed using liquid nitrogen: Yes   Cryotherapy cycles:  2 Outcome: patient tolerated procedure well with no complications   Post-procedure details: wound care instructions given    Inflamed seborrheic keratosis Right Inframammary x 1, upper mid back x 1, left lower back x 1, right lower back x 2  Prior to procedure, discussed risks of blister formation, small wound, skin dyspigmentation, or rare scar following cryotherapy. Recommend Vaseline ointment to treated areas while healing.   Destruction of lesion - Right Inframammary x 1, upper mid back x 1, left lower back x 1, right lower back x 2  Destruction method: cryotherapy   Informed consent: discussed and consent obtained   Lesion destroyed using liquid nitrogen: Yes   Cryotherapy cycles:  2 Outcome: patient tolerated procedure well with no complications   Post-procedure details: wound care instructions given    Severely dysplastic nevus - right abdomen, biopsy proven - scheduled for excision  Return today (on 12/06/2020) for Surgery, as scheduled, 7-9 days following surgery for suture removal and recheck wart and ISK's.  Graciella Belton, RMA, am acting as scribe for Forest Gleason, MD .  Documentation: I have reviewed the above documentation for accuracy and completeness, and I agree with the above.  Forest Gleason, MD

## 2020-12-20 ENCOUNTER — Encounter: Payer: Self-pay | Admitting: Dermatology

## 2020-12-26 ENCOUNTER — Encounter: Payer: Self-pay | Admitting: Dermatology

## 2020-12-26 ENCOUNTER — Ambulatory Visit (INDEPENDENT_AMBULATORY_CARE_PROVIDER_SITE_OTHER): Payer: 59 | Admitting: Dermatology

## 2020-12-26 ENCOUNTER — Other Ambulatory Visit: Payer: Self-pay

## 2020-12-26 DIAGNOSIS — D485 Neoplasm of uncertain behavior of skin: Secondary | ICD-10-CM

## 2020-12-26 DIAGNOSIS — L988 Other specified disorders of the skin and subcutaneous tissue: Secondary | ICD-10-CM | POA: Diagnosis not present

## 2020-12-26 DIAGNOSIS — D225 Melanocytic nevi of trunk: Secondary | ICD-10-CM | POA: Diagnosis not present

## 2020-12-26 MED ORDER — MUPIROCIN 2 % EX OINT
1.0000 "application " | TOPICAL_OINTMENT | Freq: Every day | CUTANEOUS | 0 refills | Status: DC
  Filled 2020-12-26: qty 22, 7d supply, fill #0

## 2020-12-26 MED ORDER — SAXENDA 18 MG/3ML ~~LOC~~ SOPN
PEN_INJECTOR | SUBCUTANEOUS | 11 refills | Status: DC
  Filled 2020-12-26: qty 15, 30d supply, fill #0
  Filled 2021-02-07: qty 15, 30d supply, fill #1

## 2020-12-26 NOTE — Patient Instructions (Addendum)
Wound Care Instructions  Cleanse wound gently with soap and water once a day then pat dry with clean gauze. Apply a thing coat of Petrolatum (petroleum jelly, "Vaseline") over the wound (unless you have an allergy to this). We recommend that you use a new, sterile tube of Vaseline. Do not pick or remove scabs. Do not remove the yellow or white "healing tissue" from the base of the wound.  Cover the wound with fresh, clean, nonstick gauze and secure with paper tape. You may use Band-Aids in place of gauze and tape if the would is small enough, but would recommend trimming much of the tape off as there is often too much. Sometimes Band-Aids can irritate the skin.  You should call the office for your biopsy report after 1 week if you have not already been contacted.  If you experience any problems, such as abnormal amounts of bleeding, swelling, significant bruising, significant pain, or evidence of infection, please call the office immediately.  FOR ADULT SURGERY PATIENTS: If you need something for pain relief you may take 1 extra strength Tylenol (acetaminophen) AND 2 Ibuprofen (200mg each) together every 4 hours as needed for pain. (do not take these if you are allergic to them or if you have a reason you should not take them.) Typically, you may only need pain medication for 1 to 3 days.   If you have any questions or concerns for your doctor, please call our main line at [phone] and press option 4 to reach your doctor's medical assistant. If no one answers, please leave a voicemail as directed and we will return your call as soon as possible. Messages left after 4 pm will be answered the following business day.   You may also send us a message via MyChart. We typically respond to MyChart messages within 1-2 business days.  For prescription refills, please ask your pharmacy to contact our office. Our fax number is [phone].  If you have an urgent issue when the clinic is closed that  cannot wait until the next business day, you can page your doctor at the number below.    Please note that while we do our best to be available for urgent issues outside of office hours, we are not available 24/7.   If you have an urgent issue and are unable to reach us, you may choose to seek medical care at your doctor's office, retail clinic, urgent care center, or emergency room.  If you have a medical emergency, please immediately call 911 or go to the emergency department.  Pager Numbers  - Dr. Kowalski: [phone]  - Dr. Moye: [phone]  - Dr. Stewart: [phone]  In the event of inclement weather, please call our main line at [phone] for an update on the status of any delays or closures.  Dermatology Medication Tips: Please keep the boxes that topical medications come in in order to help keep track of the instructions about where and how to use these. Pharmacies typically print the medication instructions only on the boxes and not directly on the medication tubes.   If your medication is too expensive, please contact our office at [phone] option 4 or send us a message through MyChart.   We are unable to tell what your co-pay for medications will be in advance as this is different depending on your insurance coverage. However, we may be able to find a substitute medication at lower cost or fill out paperwork to get insurance to cover a needed   medication.   If a prior authorization is required to get your medication covered by your insurance company, please allow us 1-2 business days to complete this process.  Drug prices often vary depending on where the prescription is filled and some pharmacies may offer cheaper prices.  The website www.goodrx.com contains coupons for medications through different pharmacies. The prices here do not account for what the cost may be with help from insurance (it may be cheaper with your insurance), but the website can give you the  price if you did not use any insurance.  - You can print the associated coupon and take it with your prescription to the pharmacy.  - You may also stop by our office during regular business hours and pick up a GoodRx coupon card.  - If you need your prescription sent electronically to a different pharmacy, notify our office through [facility] or by phone at [phone] option 4.   

## 2020-12-26 NOTE — Progress Notes (Signed)
   Follow-Up Visit   Subjective  Margaret Sellers is a 59 y.o. female who presents for the following: Procedure (Patient here today for excision of bx proven DYSPLASTIC COMPOUND NEVUS WITH SEVERE ATYPIA at right abdomen. ).   The following portions of the chart were reviewed this encounter and updated as appropriate:   Tobacco  Allergies  Meds  Problems  Med Hx  Surg Hx  Fam Hx      Review of Systems:  No other skin or systemic complaints except as noted in HPI or Assessment and Plan.  Objective  Well appearing patient in no apparent distress; mood and affect are within normal limits.  A focused examination was performed including abdomen. Relevant physical exam findings are noted in the Assessment and Plan.  Right Abdomen Pink healing biopsy site   Assessment & Plan  Neoplasm of uncertain behavior of skin Right Abdomen  Skin excision  Lesion length (cm):  1 Margin per side (cm):  0.5 Total excision diameter (cm):  2 Informed consent: discussed and consent obtained   Timeout: patient name, date of birth, surgical site, and procedure verified   Procedure prep:  Patient was prepped and draped in usual sterile fashion Prep type:  Chlorhexidine Anesthesia: the lesion was anesthetized in a standard fashion   Anesthetic:  1% lidocaine w/ epinephrine 1-100,000 buffered w/ 8.4% NaHCO3 (3cc lido w/epi, 9cc 0.25% bupivicaine) Instrument used: #15 blade   Hemostasis achieved with: suture, pressure and electrodesiccation   Outcome: patient tolerated procedure well with no complications   Post-procedure details: wound care instructions given   Additional details:  Mupirocin and a pressure dressing applied  Skin repair Complexity:  Intermediate Final length (cm):  5.4 Informed consent: discussed and consent obtained   Timeout: patient name, date of birth, surgical site, and procedure verified   Procedure prep:  Patient was prepped and draped in usual sterile fashion Prep type:   Chlorhexidine Anesthesia: the lesion was anesthetized in a standard fashion   Anesthetic:  1% lidocaine w/ epinephrine 1-100,000 local infiltration Reason for type of repair: reduce tension to allow closure, reduce the risk of dehiscence, infection, and necrosis, reduce subcutaneous dead space and avoid a hematoma, allow closure of the large defect, allow side-to-side closure without requiring a flap or graft and enhance both functionality and cosmetic results   Undermining: area extensively undermined   Subcutaneous layers (deep stitches):  Suture size:  3-0 Suture type: Vicryl (polyglactin 910)   Stitches:  Buried vertical mattress Fine/surface layer approximation (top stitches):  Suture size:  4-0 Suture type: Prolene (polypropylene)   Suture removal (days):  7 Hemostasis achieved with: pressure and electrodesiccation Outcome: patient tolerated procedure well with no complications   Post-procedure details: wound care instructions given   Additional details:  Mupirocin and a pressure bandage applied   mupirocin ointment (BACTROBAN) 2 % Apply 1 application topically daily. With dressing changes  Specimen 1 - Surgical pathology Differential Diagnosis: Bx proven DYSPLASTIC COMPOUND NEVUS WITH SEVERE ATYPIA  Check Margins: yes Pink healing biopsy site Tagged at 3 o'clock [phone]    Return in about 1 week (around 01/02/2021) for Suture Removal.  Graciella Belton, RMA, am acting as scribe for Forest Gleason, MD .  Documentation: I have reviewed the above documentation for accuracy and completeness, and I agree with the above.  Forest Gleason, MD

## 2021-01-02 ENCOUNTER — Ambulatory Visit (INDEPENDENT_AMBULATORY_CARE_PROVIDER_SITE_OTHER): Payer: 59 | Admitting: Dermatology

## 2021-01-02 ENCOUNTER — Other Ambulatory Visit: Payer: Self-pay

## 2021-01-02 DIAGNOSIS — B079 Viral wart, unspecified: Secondary | ICD-10-CM

## 2021-01-02 DIAGNOSIS — Z86018 Personal history of other benign neoplasm: Secondary | ICD-10-CM

## 2021-01-02 DIAGNOSIS — L820 Inflamed seborrheic keratosis: Secondary | ICD-10-CM | POA: Diagnosis not present

## 2021-01-02 NOTE — Progress Notes (Signed)
Follow-Up Visit   Subjective  Margaret Sellers is a 60 y.o. female who presents for the following: Follow-up (Patient here today for  follow up on right upper abdomen. Patient reports no pain but some itching at site. Patient also here for follow up on aks and warts ).  The following portions of the chart were reviewed this encounter and updated as appropriate:  Tobacco  Allergies  Meds  Problems  Med Hx  Surg Hx  Fam Hx      Review of Systems: No other skin or systemic complaints except as noted in HPI or Assessment and Plan.   Objective  Well appearing patient in no apparent distress; mood and affect are within normal limits.  A focused examination was performed including face, mid back, right arm, right medial knee, left hand, left 2nd finger, right abdomen. Relevant physical exam findings are noted in the Assessment and Plan.  right abdomen Incision site is clean, dry and intact   Left Distal 2nd Finger x 1 and right medial knee x 1 (2) Verrucous papules -- Discussed viral etiology and contagion.   mid back x 1 , right arm x 1 (2) Erythematous keratotic or waxy stuck-on papule or plaque.   Assessment & Plan  History of dysplastic nevus right abdomen  Encounter for Removal of Sutures - Incision site at the right abdomen is clean, dry and intact - Wound cleansed, sutures removed, wound cleansed and steri strips applied.  - Discussed pathology results showing NO RESIDUAL DYSPLASTIC NEVUS, MARGINS FREE  - Patient advised to keep steri-strips dry until they fall off. - Scars remodel for a full year. - Once steri-strips fall off, patient can apply over-the-counter silicone scar cream each night to help with scar remodeling if desired. - Patient advised to call with any concerns or if they notice any new or changing lesions.  Clear. Observe for recurrence. Call clinic for new or changing lesions.  Recommend regular skin exams, daily broad-spectrum spf 30+ sunscreen use, and  photoprotection.    Viral warts, unspecified type (2) Left Distal 2nd Finger x 1 and right medial knee x 1  Discussed viral etiology and risk of spread.  Discussed multiple treatments may be required to clear warts.  Discussed possible post-treatment dyspigmentation and risk of recurrence.  Squaric Acid 3% applied to warts today. Prior to application reviewed risk of inflammation and irritation.  Cantharidin Plus is a blistering agent that comes from a beetle.  It needs to be washed off in about 4 hours after application.  Although it is painless when applied in office, it may cause symptoms of mild pain and burning several hours later.  Treated areas will swell and turn red, and blisters may form.  Vaseline and a bandaid may be applied until wound has healed.  Once healed, the skin may remain temporarily discolored.  It can take weeks to months for pigmentation to return to normal.  Advised to wash off with soap and water in 4 hours or sooner if it becomes tender before then.  Prior to procedure, discussed risks of blister formation, small wound, skin dyspigmentation, or rare scar following cryotherapy. Recommend Vaseline ointment to treated areas while healing.     Destruction of lesion - Left Distal 2nd Finger x 1 and right medial knee x 1  Destruction method: cryotherapy   Informed consent: discussed and consent obtained   Lesion destroyed using liquid nitrogen: Yes   Cryotherapy cycles:  2 Outcome: patient tolerated procedure well  with no complications   Post-procedure details: wound care instructions given    Destruction of lesion - Left Distal 2nd Finger x 1 and right medial knee x 1  Destruction method: chemical removal   Informed consent: discussed and consent obtained   Timeout:  patient name, date of birth, surgical site, and procedure verified Chemical destruction method: cantharidin   Chemical destruction method comment:  Plus Application time:  4 hours Procedure  instructions: patient instructed to wash and dry area   Outcome: patient tolerated procedure well with no complications   Post-procedure details: wound care instructions given    Inflamed seborrheic keratosis mid back x 1 , right arm x 1  Symptomatic  Prior to procedure, discussed risks of blister formation, small wound, skin dyspigmentation, or rare scar following cryotherapy. Recommend Vaseline ointment to treated areas while healing.   Destruction of lesion - mid back x 1 , right arm x 1  Destruction method: cryotherapy   Informed consent: discussed and consent obtained   Lesion destroyed using liquid nitrogen: Yes   Cryotherapy cycles:  2 Outcome: patient tolerated procedure well with no complications   Post-procedure details: wound care instructions given    Return for 4 - 6 week follow up on isk and wart .  I, Ruthell Rummage, CMA, am acting as scribe for Forest Gleason, MD. Documentation: I have reviewed the above documentation for accuracy and completeness, and I agree with the above.  Forest Gleason, MD

## 2021-01-02 NOTE — Patient Instructions (Addendum)
After Suture Removal  If your medical team has placed Steri-Strips (white adhesive strips covering the surgical site to provide extra support): Keep the area dry until they fall off.  Do not peel them off. Just let them fall off on their own.  If the edges peel up, you can trim them with scissors.   If your team has not placed Steri-Strips: Wash the area daily with soap and water. Then coat the incision site with plain Vaseline and cover with a bandage. Do this daily for 5 days after the sutures are removed. After that, no additional wound care is generally needed.  However, if you would like to help fade the scar, you can apply a silicone scar cream, gel or sheet every night. The scar will remodel for one year after the procedure. If a skin cancer was removed, be sure to keep your appointment with your dermatologist for follow-up and let your dermatology team know if you have any new or changing spots between visits.    Cantharidin Plus is a blistering agent that comes from a beetle.  It needs to be washed off in about 4 hours after application.  Although it is painless when applied in office, it may cause symptoms of mild pain and burning several hours later.  Treated areas will swell and turn red, and blisters may form.  Vaseline and a bandaid may be applied until wound has healed.  Once healed, the skin may remain temporarily discolored.  It can take weeks to months for pigmentation to return to normal.  Advised to wash off with soap and water in 4 hours or sooner if it becomes tender before then.   Cryotherapy Aftercare  Wash gently with soap and water everyday.   Apply Vaseline and Band-Aid daily until healed.       Please call our office at [phone] for any questions or concerns.  If you have any questions or concerns for your doctor, please call our main line at [phone] and press option 4 to reach your doctor's medical assistant. If no one answers, please leave a  voicemail as directed and we will return your call as soon as possible. Messages left after 4 pm will be answered the following business day.   You may also send [place] a message via [facility]. We typically respond to MyChart messages within 1-2 business days.  For prescription refills, please ask your pharmacy to contact our office. Our fax number is [phone].  If you have an urgent issue when the clinic is closed that cannot wait until the next business day, you can page your doctor at the number below.    Please note that while we do our best to be available for urgent issues outside of office hours, we are not available 24/7.   If you have an urgent issue and are unable to reach [place], you may choose to seek medical care at your doctor's office, retail clinic, urgent care center, or emergency room.  If you have a medical emergency, please immediately call 911 or go to the emergency department.  Pager Numbers  - Dr. Nehemiah Massed: [phone]  - Dr. Laurence Ferrari: [phone]  - Dr. Nicole Kindred: [phone]  In the event of inclement weather, please call our main line at [phone] for an update on the status of any delays or closures.  Dermatology Medication Tips: Please keep the boxes that topical medications come in in order to help keep track of the instructions about where and how to use these.  Pharmacies typically print the medication instructions only on the boxes and not directly on the medication tubes.   If your medication is too expensive, please contact our office at [phone] option 4 or send [place] a message through [facility].   We are unable to tell what your co-pay for medications will be in advance as this is different depending on your insurance coverage. However, we may be able to find a substitute medication at lower cost or fill out paperwork to get insurance to cover a needed medication.   If a prior authorization is required to get your medication covered by your insurance  company, please allow [place] 1-2 business days to complete this process.  Drug prices often vary depending on where the prescription is filled and some pharmacies may offer cheaper prices.  The website www.goodrx.com contains coupons for medications through different pharmacies. The prices here do not account for what the cost may be with help from insurance (it may be cheaper with your insurance), but the website can give you the price if you did not use any insurance.  - You can print the associated coupon and take it with your prescription to the pharmacy.  - You may also stop by our office during regular business hours and pick up a GoodRx coupon card.  - If you need your prescription sent electronically to a different pharmacy, notify our office through [facility] or by phone at [phone] option 4.

## 2021-01-03 ENCOUNTER — Encounter: Payer: Self-pay | Admitting: Dermatology

## 2021-01-08 ENCOUNTER — Ambulatory Visit: Payer: 59 | Admitting: Dermatology

## 2021-02-07 ENCOUNTER — Other Ambulatory Visit: Payer: Self-pay

## 2021-02-13 ENCOUNTER — Ambulatory Visit: Payer: 59 | Admitting: Dermatology

## 2021-02-20 ENCOUNTER — Ambulatory Visit: Payer: 59 | Admitting: Dermatology

## 2021-02-20 ENCOUNTER — Other Ambulatory Visit: Payer: Self-pay

## 2021-02-20 DIAGNOSIS — L570 Actinic keratosis: Secondary | ICD-10-CM | POA: Diagnosis not present

## 2021-02-20 DIAGNOSIS — B078 Other viral warts: Secondary | ICD-10-CM

## 2021-02-20 NOTE — Progress Notes (Signed)
Follow-Up Visit   Subjective  Margaret Sellers is a 60 y.o. female who presents for the following: Warts (Patient here today for wart follow up at Left Distal 2nd Finger x 1 and right medial knee x 1. Warts treated at last visit with cantharidin plus and LN2. ) and Follow-up (Rechecking ISK's at mid back x 1 , right arm x 1. ).  Patient also with a spot at her face that she is concerned about, patient said was treated with LN2 in the past and has come back.   The following portions of the chart were reviewed this encounter and updated as appropriate:   Tobacco  Allergies  Meds  Problems  Med Hx  Surg Hx  Fam Hx      Review of Systems:  No other skin or systemic complaints except as noted in HPI or Assessment and Plan.  Objective  Well appearing patient in no apparent distress; mood and affect are within normal limits.  A focused examination was performed including face, hands, knee. Relevant physical exam findings are noted in the Assessment and Plan.  right nose x 1, left upper cutaneous lip x 1, left forehead above brow x 1, right forehead x 1 (4) Erythematous thin papules/macules with gritty scale.   Right Knee x 1, left 2nd finger x 1 (2) Verrucous papules -- Discussed viral etiology and contagion.    Assessment & Plan  AK (actinic keratosis) (4) right nose x 1, left upper cutaneous lip x 1, left forehead above brow x 1, right forehead x 1  Prior to procedure, discussed risks of blister formation, small wound, skin dyspigmentation, or rare scar following cryotherapy. Recommend Vaseline ointment to treated areas while healing.  Actinic keratoses are precancerous spots that appear secondary to cumulative UV radiation exposure/sun exposure over time. They are chronic with expected duration over 1 year. A portion of actinic keratoses will progress to squamous cell carcinoma of the skin. It is not possible to reliably predict which spots will progress to skin cancer and so  treatment is recommended to prevent development of skin cancer.  Recommend daily broad spectrum sunscreen SPF 30+ to sun-exposed areas, reapply every 2 hours as needed.  Recommend staying in the shade or wearing long sleeves, sun glasses (UVA+UVB protection) and wide brim hats (4-inch brim around the entire circumference of the hat). Call for new or changing lesions.   Destruction of lesion - right nose x 1, left upper cutaneous lip x 1, left forehead above brow x 1, right forehead x 1  Destruction method: cryotherapy   Informed consent: discussed and consent obtained   Lesion destroyed using liquid nitrogen: Yes   Cryotherapy cycles:  2 Outcome: patient tolerated procedure well with no complications   Post-procedure details: wound care instructions given    Other viral warts (2) Right Knee x 1, left 2nd finger x 1  Discussed viral etiology and risk of spread.  Discussed multiple treatments may be required to clear warts.  Discussed possible post-treatment dyspigmentation and risk of recurrence.  Squaric Acid 3% applied to warts today. Prior to application reviewed risk of inflammation and irritation.  Cantharidin Plus is a blistering agent that comes from a beetle.  It needs to be washed off in about 4 hours after application.  Although it is painless when applied in office, it may cause symptoms of mild pain and burning several hours later.  Treated areas will swell and turn red, and blisters may form.  Vaseline and  a bandaid may be applied until wound has healed.  Once healed, the skin may remain temporarily discolored.  It can take weeks to months for pigmentation to return to normal.  Advised to wash off with soap and water in 4 hours or sooner if it becomes tender before then.    Destruction of lesion - Right Knee x 1, left 2nd finger x 1  Destruction method: chemical removal   Informed consent: discussed and consent obtained   Timeout:  patient name, date of birth, surgical  site, and procedure verified Outcome: patient tolerated procedure well with no complications   Post-procedure details: wound care instructions given   Additional details:  Patient advised to set alarm to remind them to wash off with soap and water at the directed time.  Destruction of lesion - Right Knee x 1, left 2nd finger x 1  Destruction method: chemical removal   Informed consent: discussed and consent obtained   Timeout:  patient name, date of birth, surgical site, and procedure verified Chemical destruction method: cantharidin   Chemical destruction method comment:  Cantharidin plus Application time:  4 hours Procedure instructions: patient instructed to wash and dry area   Outcome: patient tolerated procedure well with no complications   Post-procedure details: wound care instructions given    Return for 4-6 weeks for wart, bx at scalp, recheck AK's.  Graciella Belton, RMA, am acting as scribe for Forest Gleason, MD .  Documentation: I have reviewed the above documentation for accuracy and completeness, and I agree with the above.  Forest Gleason, MD

## 2021-02-20 NOTE — Patient Instructions (Addendum)
Cryotherapy Aftercare  Wash gently with soap and water everyday.   Apply Vaseline and Band-Aid daily until healed.   Prior to procedure, discussed risks of blister formation, small wound, skin dyspigmentation, or rare scar following cryotherapy. Recommend Vaseline ointment to treated areas while healing.  Instructions for After In-Office Application of Cantharidin  1. This is a strong medicine; please follow ALL instructions.  2. Gently wash off with soap and water in four hours or sooner s directed by your physician.  3. **WARNING** this medicine can cause severe blistering, blood blisters, infection, and/or scarring if it is not washed off as directed.  4. Your progress will be rechecked in 1-2 months; call sooner if there are any questions or problems.  Cantharidin Plus is a blistering agent that comes from a beetle.  It needs to be washed off in about 4 hours after application.  Although it is painless when applied in office, it may cause symptoms of mild pain and burning several hours later.  Treated areas will swell and turn red, and blisters may form.  Vaseline and a bandaid may be applied until wound has healed.  Once healed, the skin may remain temporarily discolored.  It can take weeks to months for pigmentation to return to normal.  Advised to wash off with soap and water in 4 hours or sooner if it becomes tender before then.  If You Need Anything After Your Visit  If you have any questions or concerns for your doctor, please call our main line at [phone] and press option 4 to reach your doctor's medical assistant. If no one answers, please leave a voicemail as directed and we will return your call as soon as possible. Messages left after 4 pm will be answered the following business day.   You may also send [place] a message via [place]. We typically respond to MyChart messages within 1-2 business days.  For prescription refills, please ask your pharmacy to contact our office. Our  fax number is [phone].  If you have an urgent issue when the clinic is closed that cannot wait until the next business day, you can page your doctor at the number below.    Please note that while we do our best to be available for urgent issues outside of office hours, we are not available 24/7.   If you have an urgent issue and are unable to reach [place], you may choose to seek medical care at your doctor's office, retail clinic, urgent care center, or emergency room.  If you have a medical emergency, please immediately call 911 or go to the emergency department.  Pager Numbers  - Dr. Nehemiah Massed: [phone]  - Dr. Laurence Ferrari: [phone]  - Dr. Nicole Kindred: [phone]  In the event of inclement weather, please call our main line at [phone] for an update on the status of any delays or closures.  Dermatology Medication Tips: Please keep the boxes that topical medications come in in order to help keep track of the instructions about where and how to use these. Pharmacies typically print the medication instructions only on the boxes and not directly on the medication tubes.   If your medication is too expensive, please contact our office at [phone] option 4 or send [place] a message through [place].   We are unable to tell what your co-pay for medications will be in advance as this is different depending on your insurance coverage. However, we may be able to find a substitute medication at lower cost or  fill out paperwork to get insurance to cover a needed medication.   If a prior authorization is required to get your medication covered by your insurance company, please allow [place] 1-2 business days to complete this process.  Drug prices often vary depending on where the prescription is filled and some pharmacies may offer cheaper prices.  The website www.goodrx.com contains coupons for medications through different pharmacies. The prices here do not account for what the cost may be with help  from insurance (it may be cheaper with your insurance), but the website can give you the price if you did not use any insurance.  - You can print the associated coupon and take it with your prescription to the pharmacy.  - You may also stop by our office during regular business hours and pick up a GoodRx coupon card.  - If you need your prescription sent electronically to a different pharmacy, notify our office through [facility] or by phone at [phone] option 4.     Si Usted Necesita Algo Despus de Su Visita  Tambin puede enviarnos un mensaje a travs de Pharmacist, community. Por lo general respondemos a los mensajes de MyChart en el transcurso de 1 a 2 das hbiles.  Para renovar recetas, por favor pida a su farmacia que se ponga en contacto con nuestra oficina. Harland Dingwall de fax es [place] [phone].  Si tiene un asunto urgente cuando la clnica est cerrada y que no puede esperar hasta el siguiente da hbil, puede llamar/localizar a su doctor(a) al nmero que aparece a continuacin.   Por favor, tenga en cuenta que aunque hacemos todo lo posible para estar disponibles para asuntos urgentes fuera del horario de [place], no estamos disponibles las 24 horas del da, los 7 das de la [place].   Si tiene un problema urgente y no puede comunicarse con nosotros, puede optar por buscar atencin mdica  en el consultorio de su doctor(a), en una clnica privada, en un centro de atencin urgente o en una sala de emergencias.  Si tiene Engineering geologist, por favor llame inmediatamente al 911 o vaya a la sala de emergencias.  Nmeros de bper  - Dr. Nehemiah Massed: [phone]  - Dra. Moye: [phone]  - Dra. Nicole Kindred: [phone]  En caso de inclemencias del [place], por favor llame a Johnsie Kindred principal al [phone] para una actualizacin sobre el [place] de cualquier retraso o cierre.  Consejos para la medicacin en dermatologa: Por favor, guarde las cajas en las que vienen los  medicamentos de uso tpico para ayudarle a seguir las instrucciones sobre dnde y cmo usarlos. Las farmacias generalmente imprimen las instrucciones del medicamento slo en las cajas y no directamente en los tubos del [place].   Si su medicamento es muy caro, por favor, pngase en contacto con Zigmund Daniel llamando al [phone] y presione la opcin 4 o envenos un mensaje a travs de Pharmacist, community.   No podemos decirle cul ser su copago por los medicamentos por adelantado ya que esto es diferente dependiendo de la cobertura de su seguro. Sin embargo, es posible que podamos encontrar un medicamento sustituto a Electrical engineer un formulario para que el seguro cubra el medicamento que se considera necesario.   Si se requiere una autorizacin previa para que su compaa de seguros [place] su medicamento, por favor permtanos de 1 a 2 das hbiles para completar este proceso.  Los precios de los medicamentos varan con frecuencia dependiendo del Environmental consultant de dnde se surte la receta  y [place] farmacias pueden ofrecer precios ms baratos.  El sitio web www.goodrx.com tiene cupones para medicamentos de Airline pilot. Los precios aqu no tienen en cuenta lo que podra costar con la ayuda del seguro (puede ser ms barato con su seguro), pero el sitio web puede darle el precio si no utiliz Research scientist (physical sciences).  - Puede imprimir el cupn correspondiente y llevarlo con su receta a la farmacia.  - Tambin puede pasar por nuestra oficina durante el horario de atencin regular y Charity fundraiser una tarjeta de cupones de GoodRx.  - Si necesita que su receta se enve electrnicamente a una farmacia diferente, informe a nuestra oficina a travs de MyChart de [place] o por telfono llamando al [phone] y presione la opcin 4.

## 2021-03-05 ENCOUNTER — Encounter: Payer: Self-pay | Admitting: Dermatology

## 2021-03-15 ENCOUNTER — Other Ambulatory Visit: Payer: Self-pay

## 2021-03-15 MED ORDER — UNIFINE PENTIPS 31G X 5 MM MISC
0 refills | Status: DC
  Filled 2021-03-15: qty 100, 90d supply, fill #0

## 2021-03-21 ENCOUNTER — Other Ambulatory Visit: Payer: Self-pay

## 2021-03-21 ENCOUNTER — Encounter: Payer: Self-pay | Admitting: Dermatology

## 2021-03-21 ENCOUNTER — Ambulatory Visit: Payer: 59 | Admitting: Dermatology

## 2021-03-21 DIAGNOSIS — L430 Hypertrophic lichen planus: Secondary | ICD-10-CM

## 2021-03-21 DIAGNOSIS — D235 Other benign neoplasm of skin of trunk: Secondary | ICD-10-CM | POA: Diagnosis not present

## 2021-03-21 DIAGNOSIS — L821 Other seborrheic keratosis: Secondary | ICD-10-CM

## 2021-03-21 DIAGNOSIS — B078 Other viral warts: Secondary | ICD-10-CM

## 2021-03-21 DIAGNOSIS — L720 Epidermal cyst: Secondary | ICD-10-CM

## 2021-03-21 DIAGNOSIS — D224 Melanocytic nevi of scalp and neck: Secondary | ICD-10-CM

## 2021-03-21 DIAGNOSIS — D485 Neoplasm of uncertain behavior of skin: Secondary | ICD-10-CM

## 2021-03-21 DIAGNOSIS — L570 Actinic keratosis: Secondary | ICD-10-CM | POA: Diagnosis not present

## 2021-03-21 DIAGNOSIS — D229 Melanocytic nevi, unspecified: Secondary | ICD-10-CM

## 2021-03-21 DIAGNOSIS — L308 Other specified dermatitis: Secondary | ICD-10-CM | POA: Diagnosis not present

## 2021-03-21 DIAGNOSIS — D239 Other benign neoplasm of skin, unspecified: Secondary | ICD-10-CM

## 2021-03-21 MED ORDER — TRIAMCINOLONE ACETONIDE 0.1 % EX CREA
1.0000 "application " | TOPICAL_CREAM | Freq: Two times a day (BID) | CUTANEOUS | 1 refills | Status: DC | PRN
  Filled 2021-03-21: qty 80, 30d supply, fill #0

## 2021-03-21 NOTE — Patient Instructions (Addendum)
Wound Care Instructions  Cleanse wound gently with soap and water once a day then pat dry with clean gauze. Apply a thing coat of Petrolatum (petroleum jelly, "Vaseline") over the wound (unless you have an allergy to this). We recommend that you use a new, sterile tube of Vaseline. Do not pick or remove scabs. Do not remove the yellow or white "healing tissue" from the base of the wound.  Cover the wound with fresh, clean, nonstick gauze and secure with paper tape. You may use Band-Aids in place of gauze and tape if the would is small enough, but would recommend trimming much of the tape off as there is often too much. Sometimes Band-Aids can irritate the skin.  You should call the office for your biopsy report after 1 week if you have not already been contacted.  If you experience any problems, such as abnormal amounts of bleeding, swelling, significant bruising, significant pain, or evidence of infection, please call the office immediately.  FOR ADULT SURGERY PATIENTS: If you need something for pain relief you may take 1 extra strength Tylenol (acetaminophen) AND 2 Ibuprofen (200mg  each) together every 4 hours as needed for pain. (do not take these if you are allergic to them or if you have a reason you should not take them.) Typically, you may only need pain medication for 1 to 3 days.   Cryotherapy Aftercare  Wash gently with soap and water everyday.   Apply Vaseline and Band-Aid daily until healed.   Prior to procedure, discussed risks of blister formation, small wound, skin dyspigmentation, or rare scar following cryotherapy. Recommend Vaseline ointment to treated areas while healing.  Start TMC 0.1% cream twice daily for up to 2 weeks. Avoid applying to face, groin, and axilla. Use as directed. Long-term use can cause thinning of the skin.  Topical steroids (such as triamcinolone, fluocinolone, fluocinonide, mometasone, clobetasol, halobetasol, betamethasone, hydrocortisone) can cause  thinning and lightening of the skin if they are used for too long in the same area. Your physician has selected the right strength medicine for your problem and area affected on the body. Please use your medication only as directed by your physician to prevent side effects.   Recommend OTC [place] Rapid Relief Anti-Itch cream (pramoxine + menthol), CeraVe Anti-itch cream or lotion (pramoxine), Sarna lotion (Original- menthol + camphor or Sensitive- pramoxine) or Eucerin 12 hour Itch Relief lotion (menthol) up to 3 times per day to areas on body that are itchy.  Gentle Skin Care Guide  1. Bathe no more than once a day.  2. Avoid bathing in hot water  3. Use a mild soap like Dove, Vanicream, Cetaphil, CeraVe. Can use Lever 2000 or Cetaphil antibacterial soap  4. Use soap only where you need it. On most days, use it under your arms, between your legs, and on your feet. Let the water rinse other areas unless visibly dirty.  5. When you get out of the bath/shower, use a towel to gently blot your skin dry, don't rub it.  6. While your skin is still a little damp, apply a moisturizing cream such as Vanicream, CeraVe, Cetaphil, Eucerin, Sarna lotion or plain Vaseline Jelly. For hands apply Neutrogena [place] Hand Cream or Excipial Hand Cream.  7. Reapply moisturizer any time you start to itch or feel dry.  8. Sometimes using free and clear laundry detergents can be helpful. Fabric softener sheets should be avoided. Downy Free & Gentle liquid, or any liquid fabric softener that is free  of dyes and perfumes, it acceptable to use  9. If your doctor has given you prescription creams you may apply moisturizers over them   If You Need Anything After Your Visit  If you have any questions or concerns for your doctor, please call our main line at [phone] and press option 4 to reach your doctor's medical assistant. If no one answers, please leave a voicemail as directed and we will return your call  as soon as possible. Messages left after 4 pm will be answered the following business day.   You may also send [place] a message via [place]. We typically respond to MyChart messages within 1-2 business days.  For prescription refills, please ask your pharmacy to contact our office. Our fax number is [phone].  If you have an urgent issue when the clinic is closed that cannot wait until the next business day, you can page your doctor at the number below.    Please note that while we do our best to be available for urgent issues outside of office hours, we are not available 24/7.   If you have an urgent issue and are unable to reach [place], you may choose to seek medical care at your doctor's office, retail clinic, urgent care center, or emergency room.  If you have a medical emergency, please immediately call 911 or go to the emergency department.  Pager Numbers  - Dr. Nehemiah Massed: [phone]  - Dr. Laurence Ferrari: [phone]  - Dr. Nicole Kindred: [phone]  In the event of inclement weather, please call our main line at [phone] for an update on the status of any delays or closures.  Dermatology Medication Tips: Please keep the boxes that topical medications come in in order to help keep track of the instructions about where and how to use these. Pharmacies typically print the medication instructions only on the boxes and not directly on the medication tubes.   If your medication is too expensive, please contact our office at [phone] option 4 or send [place] a message through [place].   We are unable to tell what your co-pay for medications will be in advance as this is different depending on your insurance coverage. However, we may be able to find a substitute medication at lower cost or fill out paperwork to get insurance to cover a needed medication.   If a prior authorization is required to get your medication covered by your insurance company, please allow [place] 1-2 business days to complete  this process.  Drug prices often vary depending on where the prescription is filled and some pharmacies may offer cheaper prices.  The website www.goodrx.com contains coupons for medications through different pharmacies. The prices here do not account for what the cost may be with help from insurance (it may be cheaper with your insurance), but the website can give you the price if you did not use any insurance.  - You can print the associated coupon and take it with your prescription to the pharmacy.  - You may also stop by our office during regular business hours and pick up a GoodRx coupon card.  - If you need your prescription sent electronically to a different pharmacy, notify our office through [facility] or by phone at [phone] option 4.     Si Usted Necesita Algo Despus de Su Visita  Tambin puede enviarnos un mensaje a travs de Pharmacist, community. Por lo general respondemos a los mensajes de MyChart en el transcurso de 1 a 2 das hbiles.  Para renovar recetas, por favor pida a su farmacia que se ponga en contacto con nuestra oficina. Harland Dingwall de fax es [place] [phone].  Si tiene un asunto urgente cuando la clnica est cerrada y que no puede esperar hasta el siguiente da hbil, puede llamar/localizar a su doctor(a) al nmero que aparece a continuacin.   Por favor, tenga en cuenta que aunque hacemos todo lo posible para estar disponibles para asuntos urgentes fuera del horario de [place], no estamos disponibles las 24 horas del da, los 7 das de la [place].   Si tiene un problema urgente y no puede comunicarse con nosotros, puede optar por buscar atencin mdica  en el consultorio de su doctor(a), en una clnica privada, en un centro de atencin urgente o en una sala de emergencias.  Si tiene Engineering geologist, por favor llame inmediatamente al 911 o vaya a la sala de emergencias.  Nmeros de bper  - Dr. Nehemiah Massed: [phone]  - Dra. Moye: [phone]  -  Dra. Nicole Kindred: [phone]  En caso de inclemencias del [place], por favor llame a Johnsie Kindred principal al [phone] para una actualizacin sobre el [place] de cualquier retraso o cierre.  Consejos para la medicacin en dermatologa: Por favor, guarde las cajas en las que vienen los medicamentos de uso tpico para ayudarle a seguir las instrucciones sobre dnde y cmo usarlos. Las farmacias generalmente imprimen las instrucciones del medicamento slo en las cajas y no directamente en los tubos del [place].   Si su medicamento es muy caro, por favor, pngase en contacto con Zigmund Daniel llamando al [phone] y presione la opcin 4 o envenos un mensaje a travs de Pharmacist, community.   No podemos decirle cul ser su copago por los medicamentos por adelantado ya que esto es diferente dependiendo de la cobertura de su seguro. Sin embargo, es posible que podamos encontrar un medicamento sustituto a Electrical engineer un formulario para que el seguro cubra el medicamento que se considera necesario.   Si se requiere una autorizacin previa para que su compaa de seguros [place] su medicamento, por favor permtanos de 1 a 2 das hbiles para completar este proceso.  Los precios de los medicamentos varan con frecuencia dependiendo del Environmental consultant de dnde se surte la receta y alguna farmacias pueden ofrecer precios ms baratos.  El sitio web www.goodrx.com tiene cupones para medicamentos de Airline pilot. Los precios aqu no tienen en cuenta lo que podra costar con la ayuda del seguro (puede ser ms barato con su seguro), pero el sitio web puede darle el precio si no utiliz Research scientist (physical sciences).  - Puede imprimir el cupn correspondiente y llevarlo con su receta a la farmacia.  - Tambin puede pasar por nuestra oficina durante el horario de atencin regular y Charity fundraiser una tarjeta de cupones de GoodRx.  - Si necesita que su receta se enve electrnicamente a una farmacia diferente, informe a nuestra  oficina a travs de MyChart de  o por telfono llamando al [phone] y presione la opcin 4.

## 2021-03-21 NOTE — Progress Notes (Signed)
Follow-Up Visit   Subjective  Margaret Sellers is a 60 y.o. female who presents for the following: Follow-up (Patient here today for bx at scalp and AK and wart follow up. Warts at right knee and left 2nd finger treated with squaric acid 3% and cantharidin plus, improved per patient. AK's at right nose, left upper cutaneous lip, left forehead above brow and right forehead treated with LN2, improved per patient. Patient also with an itchy spot at her back, a spot at chest that has gotten larger. ).  Patient accompanied by husband.   The following portions of the chart were reviewed this encounter and updated as appropriate:   Tobacco   Allergies   Meds   Problems   Med Hx   Surg Hx   Fam Hx       Review of Systems:  No other skin or systemic complaints except as noted in HPI or Assessment and Plan.  Objective  Well appearing patient in no apparent distress; mood and affect are within normal limits.  A focused examination was performed including face, neck, chest and back and right leg. Relevant physical exam findings are noted in the Assessment and Plan.  right knee, left 2nd finger Clear   left chest 0.4 cm erythematous tan papule with pigment incontinence within 1.4 cm tan plaque R/o collision SK /Nevus vs other      Left Frontal Scalp Tan-brown and/or pink-flesh-colored symmetric macules and papules.   left upper cutaneous lip x 1, mid upper cutaneous lip x 1 (2) Erythematous thin papules/macules with gritty scale.   Mid Back Scaly pink papules coalescing to plaques   Right Mid Back 0.6 cm subcutaneous papule  Right Upper Back Firm pink/brown papulenodule with dimple sign.    Assessment & Plan  Other viral warts right knee, left 2nd finger  Monitor for recurrence  Neoplasm of uncertain behavior of skin left chest  Skin / nail biopsy Type of biopsy: tangential   Informed consent: discussed and consent obtained   Timeout: patient name, date of birth, surgical  site, and procedure verified   Patient was prepped and draped in usual sterile fashion: Area prepped with isopropyl alcohol. Anesthesia: the lesion was anesthetized in a standard fashion   Local anesthetic: 0.25% bupivicaine. Instrument used: flexible razor blade   Hemostasis achieved with: aluminum chloride and electrodesiccation   Outcome: patient tolerated procedure well   Post-procedure details: wound care instructions given   Additional details:  Mupirocin and a bandage applied  Specimen 1 - Surgical pathology Differential Diagnosis: R/o collision SK /Nevus vs other  Check Margins: No 1.4 cm tan papule with pigment incontinence at inferior border at site of more raised papule   Related Medications mupirocin ointment (BACTROBAN) 2 % Apply 1 application topically daily. With dressing changes  Nevus Left Frontal Scalp  Benign-appearing.  Observation.  Call clinic for new or changing lesions.  Recommend daily use of broad spectrum spf 30+ sunscreen to sun-exposed areas.    AK (actinic keratosis) (2) left upper cutaneous lip x 1, mid upper cutaneous lip x 1  Prior to procedure, discussed risks of blister formation, small wound, skin dyspigmentation, or rare scar following cryotherapy. Recommend Vaseline ointment to treated areas while healing.  Actinic keratoses are precancerous spots that appear secondary to cumulative UV radiation exposure/sun exposure over time. They are chronic with expected duration over 1 year. A portion of actinic keratoses will progress to squamous cell carcinoma of the skin. It is not possible to  reliably predict which spots will progress to skin cancer and so treatment is recommended to prevent development of skin cancer.  Recommend daily broad spectrum sunscreen SPF 30+ to sun-exposed areas, reapply every 2 hours as needed.  Recommend staying in the shade or wearing long sleeves, sun glasses (UVA+UVB protection) and wide brim hats (4-inch brim around the  entire circumference of the hat). Call for new or changing lesions.    Destruction of lesion - left upper cutaneous lip x 1, mid upper cutaneous lip x 1  Destruction method: cryotherapy   Informed consent: discussed and consent obtained   Lesion destroyed using liquid nitrogen: Yes   Cryotherapy cycles:  2 Outcome: patient tolerated procedure well with no complications   Post-procedure details: wound care instructions given    Other eczema Mid Back  Start TMC 0.1% cream twice daily for up to 2 weeks. Avoid applying to face, groin, and axilla. Use as directed. Long-term use can cause thinning of the skin.  Topical steroids (such as triamcinolone, fluocinolone, fluocinonide, mometasone, clobetasol, halobetasol, betamethasone, hydrocortisone) can cause thinning and lightening of the skin if they are used for too long in the same area. Your physician has selected the right strength medicine for your problem and area affected on the body. Please use your medication only as directed by your physician to prevent side effects.    triamcinolone cream (KENALOG) 0.1 % - Mid Back Apply 1 application topically 2 (two) times daily as needed. For up to 2 weeks as needed for itch. Avoid applying to face, groin, and axilla. Use as directed. Long-term use can cause thinning of the skin.  Epidermal inclusion cyst Right Mid Back  Cyst with symptoms and/or recent change.  Discussed surgical excision to remove, including resulting scar and possible recurrence.  Patient will schedule for surgery. Pre-op information given.     Dermatofibroma Right Upper Back  Benign-appearing.  Observation.  Call clinic for new or changing lesions.    Seborrheic Keratoses - Stuck-on, waxy, tan-brown papules and/or plaques  - Benign-appearing - Discussed benign etiology and prognosis. - Observe - Call for any changes  Return for Surgery.  Graciella Belton, RMA, am acting as scribe for Forest Gleason, MD  .  Documentation: I have reviewed the above documentation for accuracy and completeness, and I agree with the above.  Forest Gleason, MD

## 2021-04-04 ENCOUNTER — Telehealth: Payer: Self-pay

## 2021-04-04 NOTE — Telephone Encounter (Signed)
Left message for patient bx results benign and have been sent to her via MyChart./js

## 2021-05-01 ENCOUNTER — Encounter: Payer: 59 | Admitting: Dermatology

## 2021-05-15 ENCOUNTER — Encounter: Payer: 59 | Admitting: Dermatology

## 2021-06-10 ENCOUNTER — Other Ambulatory Visit: Payer: Self-pay

## 2021-06-10 DIAGNOSIS — K769 Liver disease, unspecified: Secondary | ICD-10-CM | POA: Diagnosis not present

## 2021-06-10 DIAGNOSIS — Z0000 Encounter for general adult medical examination without abnormal findings: Secondary | ICD-10-CM | POA: Diagnosis not present

## 2021-06-10 DIAGNOSIS — N200 Calculus of kidney: Secondary | ICD-10-CM | POA: Diagnosis not present

## 2021-06-10 DIAGNOSIS — R350 Frequency of micturition: Secondary | ICD-10-CM | POA: Diagnosis not present

## 2021-06-10 DIAGNOSIS — Z13220 Encounter for screening for lipoid disorders: Secondary | ICD-10-CM | POA: Diagnosis not present

## 2021-06-10 DIAGNOSIS — F411 Generalized anxiety disorder: Secondary | ICD-10-CM | POA: Diagnosis not present

## 2021-06-10 DIAGNOSIS — Z131 Encounter for screening for diabetes mellitus: Secondary | ICD-10-CM | POA: Diagnosis not present

## 2021-06-10 MED ORDER — ESCITALOPRAM OXALATE 5 MG PO TABS
ORAL_TABLET | ORAL | 5 refills | Status: DC
  Filled 2021-06-10: qty 30, 30d supply, fill #0

## 2021-06-12 ENCOUNTER — Ambulatory Visit
Admission: RE | Admit: 2021-06-12 | Discharge: 2021-06-12 | Disposition: A | Discharge status: Home / Self Care | Payer: 59 | Attending: Urology | Admitting: Urology

## 2021-06-12 ENCOUNTER — Encounter: Payer: Self-pay | Admitting: Urology

## 2021-06-12 ENCOUNTER — Encounter: Payer: 59 | Admitting: Dermatology

## 2021-06-12 ENCOUNTER — Other Ambulatory Visit: Payer: Self-pay | Admitting: *Deleted

## 2021-06-12 ENCOUNTER — Ambulatory Visit: Payer: 59 | Admitting: Urology

## 2021-06-12 ENCOUNTER — Other Ambulatory Visit: Payer: Self-pay

## 2021-06-12 ENCOUNTER — Ambulatory Visit
Admission: RE | Admit: 2021-06-12 | Discharge: 2021-06-12 | Disposition: A | Discharge status: Home / Self Care | Payer: 59 | Source: Ambulatory Visit | Attending: Urology | Admitting: Urology

## 2021-06-12 VITALS — BP 98/61 | HR 69 | Ht 66.0 in | Wt 219.0 lb

## 2021-06-12 DIAGNOSIS — N200 Calculus of kidney: Secondary | ICD-10-CM

## 2021-06-12 NOTE — Progress Notes (Signed)
? ?  06/12/2021 ?2:48 PM  ? ?Mar Zettler Throgmorton ?15-Jun-1960 ?[id number] ? ?Reason for visit: Follow up nephrolithiasis ? 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 who has a history of bariatric surgery, and 20+ stone episodes in the past.  She had a microscopic hematuria work-up that showed bilateral renal stones, and cystoscopy was normal.  She had considered shockwave lithotripsy of the larger left 7 mm lower pole stone, but ultimately canceled this procedure and opted for observation. ? ?She denies any major issues over the last year, and has not passed any kidney stones.  She denies any gross hematuria.  Recent urinalysis 06/10/2021 was benign. ? ?I personally viewed and interpreted her KUB today that shows a stable 7 mm left lower pole stone, and a stable 5 mm right midpole stone. ? ?We reviewed options again at length including observation, shockwave lithotripsy, or ureteroscopy, and the risks and benefits were reviewed.  Her husband was recently diagnosed with colon cancer, and she would like to continue observation as she has done well over the last few years which is very reasonable.  Return precautions were discussed at length. ? ?RTC 1 year for KUB for surveillance of bilateral renal stones ? ?Billey Co, MD ? ?[facility] ?[address], [address] ?[place], [place] [address] ?[phone] ? ? ?

## 2021-06-12 NOTE — Patient Instructions (Signed)
Dietary Guidelines to Help Prevent Kidney Stones Kidney stones are deposits of minerals and salts that form inside your kidneys. Your risk of developing kidney stones may be greater depending on your diet, your lifestyle, the medicines you take, and whether you have certain medical conditions. Most people can lower their chances of developing kidney stones by following the instructions below. Your dietitian may give you more specific instructions depending on your overall health and the type of kidney stones you tend to develop. What are tips for following this plan? Reading food labels  Choose foods with "no salt added" or "low-salt" labels. Limit your salt (sodium) intake to less than 1,500 mg a day. Choose foods with calcium for each meal and snack. Try to eat about 300 mg of calcium at each meal. Foods that contain 200-500 mg of calcium a serving include: 8 oz (237 mL) of milk, calcium-fortifiednon-dairy milk, and calcium-fortifiedfruit juice. Calcium-fortified means that calcium has been added to these drinks. 8 oz (237 mL) of kefir, yogurt, and soy yogurt. 4 oz (114 g) of tofu. 1 oz (28 g) of cheese. 1 cup (150 g) of dried figs. 1 cup (91 g) of cooked broccoli. One 3 oz (85 g) can of sardines or mackerel. Most people need 1,000-1,500 mg of calcium a day. Talk to your dietitian about how much calcium is recommended for you. Shopping Buy plenty of fresh fruits and vegetables. Most people do not need to avoid fruits and vegetables, even if these foods contain nutrients that may contribute to kidney stones. When shopping for convenience foods, choose: Whole pieces of fruit. Pre-made salads with dressing on the side. Low-fat fruit and yogurt smoothies. Avoid buying frozen meals or prepared deli foods. These can be high in sodium. Look for foods with live cultures, such as yogurt and kefir. Choose high-fiber grains, such as whole-wheat breads, oat bran, and wheat cereals. Cooking Do not add  salt to food when cooking. Place a salt shaker on the table and allow each person to add his or her own salt to taste. Use vegetable protein, such as beans, textured vegetable protein (TVP), or tofu, instead of meat in pasta, casseroles, and soups. Meal planning Eat less salt, if told by your dietitian. To do this: Avoid eating processed or pre-made food. Avoid eating fast food. Eat less animal protein, including cheese, meat, poultry, or fish, if told by your dietitian. To do this: Limit the number of times you have meat, poultry, fish, or cheese each week. Eat a diet free of meat at least 2 days a week. Eat only one serving each day of meat, poultry, fish, or seafood. When you prepare animal protein, cut pieces into small portion sizes. For most meat and fish, one serving is about the size of the palm of your hand. Eat at least five servings of fresh fruits and vegetables each day. To do this: Keep fruits and vegetables on hand for snacks. Eat one piece of fruit or a handful of berries with breakfast. Have a salad and fruit at lunch. Have two kinds of vegetables at dinner. Limit foods that are high in a substance called oxalate. These include: Spinach (cooked), rhubarb, beets, sweet potatoes, and Swiss chard. Peanuts. Potato chips, french fries, and baked potatoes with skin on. Nuts and nut products. Chocolate. If you regularly take a diuretic medicine, make sure to eat at least 1 or 2 servings of fruits or vegetables that are high in potassium each day. These include: Avocado. Banana. Orange, prune,   carrot, or tomato juice. Baked potato. Cabbage. Beans and split peas. Lifestyle  Drink enough fluid to keep your urine pale yellow. This is the most important thing you can do. Spread your fluid intake throughout the day. If you drink alcohol: Limit how much you use to: 0-1 drink a day for women who are not pregnant. 0-2 drinks a day for men. Be aware of how much alcohol is in your  drink. In the [place] one drink equals one 12 oz bottle of beer (355 mL), one 5 oz glass of wine (148 mL), or one 1 oz glass of hard liquor (44 mL). Lose weight if told by your health care provider. Work with your dietitian to find an eating plan and weight loss strategies that work best for you. General information Talk to your health care provider and dietitian about taking daily supplements. You may be told the following depending on your health and the cause of your kidney stones: Not to take supplements with vitamin C. To take a calcium supplement. To take a daily probiotic supplement. To take other supplements such as magnesium, fish oil, or vitamin B6. Take over-the-counter and prescription medicines only as told by your health care provider. These include supplements. What foods should I limit? Limit your intake of the following foods, or eat them as told by your dietitian. Vegetables Spinach. Rhubarb. Beets. Canned vegetables. Pickles. Olives. Baked potatoes with skin. Grains Wheat bran. Baked goods. Salted crackers. Cereals high in sugar. Meats and other proteins Nuts. Nut butters. Large portions of meat, poultry, or fish. Salted, precooked, or cured meats, such as sausages, meat loaves, and hot dogs. Dairy Cheese. Beverages Regular soft drinks. Regular vegetable juice. Seasonings and condiments Seasoning blends with salt. Salad dressings. Soy sauce. Ketchup. Barbecue sauce. Other foods Canned soups. Canned pasta sauce. Casseroles. Pizza. Lasagna. Frozen meals. Potato chips. French fries. The items listed above may not be a complete list of foods and beverages you should limit. Contact a dietitian for more information. What foods should I avoid? Talk to your dietitian about specific foods you should avoid based on the type of kidney stones you have and your overall health. Fruits Grapefruit. The item listed above may not be a complete list of foods and beverages you should  avoid. Contact a dietitian for more information. Summary Kidney stones are deposits of minerals and salts that form inside your kidneys. You can lower your risk of kidney stones by making changes to your diet. The most important thing you can do is drink enough fluid. Drink enough fluid to keep your urine pale yellow. Talk to your dietitian about how much calcium you should have each day, and eat less salt and animal protein as told by your dietitian. This information is not intended to replace advice given to you by your health care provider. Make sure you discuss any questions you have with your health care provider. Document Revised: 03/17/2019 Document Reviewed: 03/17/2019 Elsevier Patient Education  2022 Elsevier Inc.  

## 2021-06-20 ENCOUNTER — Other Ambulatory Visit: Payer: Self-pay

## 2021-06-20 DIAGNOSIS — J019 Acute sinusitis, unspecified: Secondary | ICD-10-CM | POA: Diagnosis not present

## 2021-06-20 MED ORDER — PREDNISONE 10 MG PO TABS
ORAL_TABLET | ORAL | 0 refills | Status: DC
  Filled 2021-06-20: qty 7, 7d supply, fill #0

## 2021-06-20 MED ORDER — AZITHROMYCIN 250 MG PO TABS
ORAL_TABLET | ORAL | 0 refills | Status: DC
  Filled 2021-06-20: qty 6, 5d supply, fill #0

## 2021-06-24 ENCOUNTER — Encounter: Payer: Self-pay | Admitting: Dermatology

## 2021-07-29 ENCOUNTER — Encounter: Payer: 59 | Admitting: Dermatology

## 2021-09-04 ENCOUNTER — Other Ambulatory Visit: Payer: Self-pay

## 2021-09-04 DIAGNOSIS — L299 Pruritus, unspecified: Secondary | ICD-10-CM | POA: Diagnosis not present

## 2021-09-04 DIAGNOSIS — R600 Localized edema: Secondary | ICD-10-CM | POA: Diagnosis not present

## 2021-09-04 MED ORDER — CAPSAICIN 0.025 % EX CREA: TOPICAL_CREAM | CUTANEOUS | 0 refills | Status: DC

## 2021-09-04 MED ORDER — HYDROXYZINE HCL 50 MG PO TABS
ORAL_TABLET | ORAL | 0 refills | Status: DC
  Filled 2021-09-04: qty 15, 7d supply, fill #0

## 2021-09-04 MED ORDER — TRIAMCINOLONE ACETONIDE 0.1 % EX CREA
TOPICAL_CREAM | CUTANEOUS | 0 refills | Status: DC
  Filled 2021-09-04: qty 30, 7d supply, fill #0

## 2021-09-05 ENCOUNTER — Encounter: Payer: Self-pay | Admitting: Cardiology

## 2021-09-05 ENCOUNTER — Ambulatory Visit: Payer: 59 | Admitting: Cardiology

## 2021-09-05 VITALS — BP 126/68 | HR 68 | Ht 66.5 in | Wt 224.6 lb

## 2021-09-05 DIAGNOSIS — R600 Localized edema: Secondary | ICD-10-CM

## 2021-09-05 DIAGNOSIS — I8390 Asymptomatic varicose veins of unspecified lower extremity: Secondary | ICD-10-CM

## 2021-09-05 NOTE — Progress Notes (Addendum)
Cardiology Office Note:    Date:  09/05/2021   ID:  Margaret Sellers, DOB 1960-11-04, MRN [id number]  PCP:  Donnamarie Rossetti, PA-C   [facility] Providers Cardiologist:  None     Referring MD: Lennie Muckle, FNP   No chief complaint on file.  Margaret Sellers is a 61 y.o. female who is being seen today for the evaluation of edema at the request of Lennie Muckle, [place].   History of Present Illness:    Margaret Sellers is a 61 y.o. female with a hx of history of dysplastic nevus who presents due to leg swelling.  Patient was evaluated by her primary care provider, leg swelling noted.  Symptoms usually get worse as the day progresses, has been going on for several weeks now.  Has varicose veins in her left leg.  Denies chest pain or shortness of breath.  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  She denies any history of heart disease, denies smoking.  Past Medical History:  Diagnosis Date   Dysplastic nevus 05/24/2008   Upper mid back - moderate   Dysplastic nevus 10/31/2020   right abdomen, severe. Excised 12/26/2020, margins free    History reviewed. No pertinent surgical history.  Current Medications: Current Meds  Medication Sig   capsaicin (ZOSTRIX) 0.025 % cream Apply 1 Each (topical) 3 times per day for 7 days   hydrOXYzine (ATARAX) 50 MG tablet Take 1 tablet (oral) every 12 hours   triamcinolone ointment (KENALOG) 0.1 % BID PRN itching   [DISCONTINUED] triamcinolone cream (KENALOG) 0.1 % Apply 1 Each (topical) 2 times per day for 7 days     Allergies:   Augmentin [amoxicillin-pot clavulanate] and Sulfa antibiotics   Social History   Socioeconomic History   Marital status: Married    Spouse name: Not on file   Number of children: Not on file   Years of education: Not on file   Highest education level: Not on file  Occupational History   Not on file  Tobacco Use    Smoking status: Never   Smokeless tobacco: Never  Vaping Use   Vaping Use: Never used  Substance and Sexual Activity   Alcohol use: No   Drug use: No   Sexual activity: Yes  Other Topics Concern   Not on file  Social History Narrative   Not on file   Social Determinants of Health   Financial Resource Strain: Not on file  Food Insecurity: Not on file  Transportation Needs: Not on file  Physical Activity: Not on file  Stress: Not on file  Social Connections: Not on file     Family History: The patient's family history includes Alzheimer's disease in her father; Healthy in her mother.  ROS:   Please see the history of present illness.     All other systems reviewed and are negative.  EKGs/Labs/Other Studies Reviewed:    The following studies were reviewed today:   EKG:  EKG is  ordered today.  The ekg ordered today demonstrates normal sinus rhythm, normal ECG.  Recent Labs: No results found for requested labs within last 8760 hours.  Recent Lipid Panel No results found for: CHOL, TRIG, HDL, CHOLHDL, VLDL, LDLCALC, LDLDIRECT   Risk Assessment/Calculations:          Physical Exam:    VS:  BP 126/68   Pulse 68  Ht 5' 6.5" (1.689 m)   Wt 224 lb 9.6 oz (101.9 kg)   SpO2 99%   BMI 35.71 kg/m     Wt Readings from Last 3 Encounters:  09/05/21 224 lb 9.6 oz (101.9 kg)  06/12/21 219 lb (99.3 kg)  09/16/18 216 lb (98 kg)     GEN:  Well nourished, well developed in no acute distress HEENT: Normal NECK: No JVD; No carotid bruits LYMPHATICS: No lymphadenopathy CARDIAC: RRR, no murmurs, rubs, gallops RESPIRATORY:  Clear to auscultation without rales, wheezing or rhonchi  ABDOMEN: Soft, non-tender, non-distended MUSCULOSKELETAL: Trace edema; varicose veins noted on left leg SKIN: Warm and dry NEUROLOGIC:  Alert and oriented x 3 PSYCHIATRIC:  Normal affect   ASSESSMENT:    1. Leg edema   2. Varicose veins without complication    PLAN:    In order of  problems listed above:  Leg edema, obtain echocardiogram to rule out cardiac dysfunction.  Etiology possibly from venous insufficiency/dependent edema.  Compression stockings, leg raise advised. Varicose veins in lower extremity, obtain lower extremity ultrasound with reflux to evaluate venous insufficiency.  Compression stockings, leg raising as above.  Follow-up after echo and lower extremity ultrasound.       Medication Adjustments/Labs and Tests Ordered: Current medicines are reviewed at length with the patient today.  Concerns regarding medicines are outlined above.  Orders Placed This Encounter  Procedures   ECHOCARDIOGRAM COMPLETE   VAS [place] LOWER EXTREMITY VENOUS REFLUX   No orders of the defined types were placed in this encounter.   Patient Instructions  Medication Instructions:   Your physician recommends that you continue on your current medications as directed. Please refer to the Current Medication list given to you today.   *If you need a refill on your cardiac medications before your next appointment, please call your pharmacy*   Lab Work:  None ordered   Testing/Procedures:   Your physician has requested that you have a lower or upper extremity venous duplex with reflux. This test is an ultrasound of the veins in the legs or arms. It looks at venous blood flow that carries blood from the heart to the legs or arms. Allow one hour for a Lower Venous exam. Allow thirty minutes for an Upper Venous exam. There are no restrictions or special instructions.  2.  Your physician has requested that you have an echocardiogram. Echocardiography is a painless test that uses sound waves to create images of your heart. It provides your doctor with information about the size and shape of your heart and how well your heart's chambers and valves are working. This procedure takes approximately one hour. There are no restrictions for this procedure.      Follow-Up: At [facility], you and your health needs are our priority.  As part of our continuing mission to provide you with exceptional heart care, we have created designated Provider Care Teams.  These Care Teams include your primary Cardiologist (physician) and Advanced Practice Providers (APPs -  Physician Assistants and Nurse Practitioners) who all work together to provide you with the care you need, when you need it.  We recommend signing up for the patient portal called "MyChart".  Sign up information is provided on this After Visit Summary.  MyChart is used to connect with patients for Virtual Visits (Telemedicine).  Patients are able to view lab/test results, encounter notes, upcoming appointments, etc.  Non-urgent messages can be sent to your provider as well.   To learn  more about what you can do with MyChart, go to [email or web address].    Your next appointment:   Follow up after testing   The format for your next appointment:   In Person  Provider:   You may see Kate Sable, MD or one of the following Advanced Practice Providers on your designated Care Team:   Murray Hodgkins, NP Christell Faith, PA-C Cadence Kathlen Mody, [place]    Other Instructions   Important Information About Sugar         Signed, Kate Sable, MD  09/05/2021 1:37 PM    [place]

## 2021-09-05 NOTE — Addendum Note (Signed)
Addended by: Kavin Leech on: 09/05/2021 01:38 PM   Modules accepted: Orders

## 2021-09-05 NOTE — Patient Instructions (Signed)
Medication Instructions:   Your physician recommends that you continue on your current medications as directed. Please refer to the Current Medication list given to you today.   *If you need a refill on your cardiac medications before your next appointment, please call your pharmacy*   Lab Work:  None ordered   Testing/Procedures:   Your physician has requested that you have a lower or upper extremity venous duplex with reflux. This test is an ultrasound of the veins in the legs or arms. It looks at venous blood flow that carries blood from the heart to the legs or arms. Allow one hour for a Lower Venous exam. Allow thirty minutes for an Upper Venous exam. There are no restrictions or special instructions.  2.  Your physician has requested that you have an echocardiogram. Echocardiography is a painless test that uses sound waves to create images of your heart. It provides your doctor with information about the size and shape of your heart and how well your heart's chambers and valves are working. This procedure takes approximately one hour. There are no restrictions for this procedure.      Follow-Up: At [facility], you and your health needs are our priority.  As part of our continuing mission to provide you with exceptional heart care, we have created designated Provider Care Teams.  These Care Teams include your primary Cardiologist (physician) and Advanced Practice Providers (APPs -  Physician Assistants and Nurse Practitioners) who all work together to provide you with the care you need, when you need it.  We recommend signing up for the patient portal called "MyChart".  Sign up information is provided on this After Visit Summary.  MyChart is used to connect with patients for Virtual Visits (Telemedicine).  Patients are able to view lab/test results, encounter notes, upcoming appointments, etc.  Non-urgent messages can be sent to your provider as well.   To learn more about what you  can do with MyChart, go to [email or web address].    Your next appointment:   Follow up after testing   The format for your next appointment:   In Person  Provider:   You may see Kate Sable, MD or one of the following Advanced Practice Providers on your designated Care Team:   Murray Hodgkins, NP Christell Faith, PA-C Cadence Kathlen Mody, [place]    Other Instructions   Important Information About Sugar

## 2021-10-10 ENCOUNTER — Ambulatory Visit (INDEPENDENT_AMBULATORY_CARE_PROVIDER_SITE_OTHER): Payer: 59

## 2021-10-10 DIAGNOSIS — R600 Localized edema: Secondary | ICD-10-CM

## 2021-10-10 LAB — ECHOCARDIOGRAM COMPLETE
AR max vel: 1.73 cm2
AV Area VTI: 1.93 cm2
AV Area mean vel: 1.93 cm2
AV Mean grad: 4 mmHg
AV Peak grad: 8.5 mmHg
Ao pk vel: 1.46 m/s
Area-P 1/2: 3.42 cm2
S' Lateral: 2.8 cm
Single Plane A4C EF: 70.4 %

## 2021-10-10 MED ORDER — PERFLUTREN LIPID MICROSPHERE
1.0000 mL | INTRAVENOUS | Status: AC | PRN
  Administered 2021-10-10: 2 mL via INTRAVENOUS

## 2021-10-11 ENCOUNTER — Telehealth: Payer: Self-pay

## 2021-10-11 NOTE — Telephone Encounter (Signed)
Called patient and left a VM requesting a call back and informing her that I also sent a MyChart that she could respond to as well.

## 2021-10-11 NOTE — Telephone Encounter (Signed)
-----   Message from Kate Sable, MD sent at 10/10/2021  4:34 PM EDT ----- Echocardiogram shows normal systolic function, diastolic function appears normal.  No significant valvular abnormalities or structural abnormalities noted on echocardiogram to suggest etiology of edema.  Continue management with compression stockings, leg raising. keep follow-up appointment.

## 2021-10-15 ENCOUNTER — Telehealth: Payer: Self-pay | Admitting: Cardiology

## 2021-10-15 NOTE — Telephone Encounter (Signed)
Follow Up:   Patient is returning a call from 10-11-21 , concerning her test results.

## 2021-10-17 NOTE — Telephone Encounter (Signed)
Called patient and reviewed the Echo results and Venous US results. Patient verbalized understanding and confirmed her follow up appointment on 10/24/21.

## 2021-10-22 ENCOUNTER — Ambulatory Visit: Payer: 59 | Admitting: Cardiovascular Disease

## 2021-10-24 ENCOUNTER — Ambulatory Visit: Payer: 59 | Admitting: Cardiology

## 2021-12-11 ENCOUNTER — Other Ambulatory Visit: Payer: Self-pay

## 2021-12-11 DIAGNOSIS — M25571 Pain in right ankle and joints of right foot: Secondary | ICD-10-CM | POA: Diagnosis not present

## 2021-12-11 DIAGNOSIS — F411 Generalized anxiety disorder: Secondary | ICD-10-CM | POA: Diagnosis not present

## 2021-12-11 DIAGNOSIS — N898 Other specified noninflammatory disorders of vagina: Secondary | ICD-10-CM | POA: Diagnosis not present

## 2021-12-11 MED ORDER — EPINEPHRINE 0.3 MG/0.3ML IJ SOAJ
INTRAMUSCULAR | 0 refills | Status: DC
  Filled 2021-12-11: qty 2, 30d supply, fill #0

## 2021-12-13 ENCOUNTER — Ambulatory Visit: Payer: 59 | Attending: Cardiology | Admitting: Cardiology

## 2021-12-13 ENCOUNTER — Encounter: Payer: Self-pay | Admitting: Cardiology

## 2021-12-13 VITALS — BP 120/78 | HR 61 | Ht 68.0 in | Wt 224.1 lb

## 2021-12-13 DIAGNOSIS — R600 Localized edema: Secondary | ICD-10-CM

## 2021-12-13 DIAGNOSIS — I8390 Asymptomatic varicose veins of unspecified lower extremity: Secondary | ICD-10-CM

## 2021-12-13 NOTE — Progress Notes (Signed)
Cardiology Office Note:    Date:  12/13/2021   ID:  Margaret Sellers, DOB Feb 22, 1961, MRN [id number]  PCP:  Donnamarie Rossetti, PA-C   [facility] Providers Cardiologist:  None     Referring MD: Donnamarie Rossetti,*   Chief Complaint  Patient presents with   Other    F/u echo/LE testing. Meds reviewed verbally with pt.    History of Present Illness:    Margaret Sellers is a 61 y.o. female with a hx of history of dysplastic nevus, varicose veins who presents due to leg swelling.  Echo was ordered to evaluate any cardiac dysfunction.  Lower extremity ultrasound obtained to evaluate any DVT.  Compression stockings and leg raising was advised.  Denies chest pain or shortness of breath.  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  Feels well, denies any other concerns at this time.   Past Medical History:  Diagnosis Date   Dysplastic nevus 05/24/2008   Upper mid back - moderate   Dysplastic nevus 10/31/2020   right abdomen, severe. Excised 12/26/2020, margins free    History reviewed. No pertinent surgical history.  Current Medications: Current Meds  Medication Sig   EPINEPHrine 0.3 mg/0.3 mL IJ SOAJ injection Inject 0.3 mLs (0.3 mg total) into the muscle once as needed for Anaphylaxis for up to 1 dose   hydrOXYzine (ATARAX) 50 MG tablet Take 1 tablet (oral) every 12 hours (Patient taking differently: as needed.)   triamcinolone ointment (KENALOG) 0.1 % BID PRN itching     Allergies:   Augmentin [amoxicillin-pot clavulanate] and Sulfa antibiotics   Social History   Socioeconomic History   Marital status: Married    Spouse name: Not on file   Number of children: Not on file   Years of education: Not on file   Highest education level: Not on file  Occupational History   Not on file  Tobacco Use   Smoking status: Never   Smokeless tobacco: Never  Vaping Use   Vaping Use: Never used  Substance and  Sexual Activity   Alcohol use: No   Drug use: No   Sexual activity: Yes  Other Topics Concern   Not on file  Social History Narrative   Not on file   Social Determinants of Health   Financial Resource Strain: Not on file  Food Insecurity: Not on file  Transportation Needs: Not on file  Physical Activity: Not on file  Stress: Not on file  Social Connections: Not on file     Family History: The patient's family history includes Alzheimer's disease in her father; Healthy in her mother.  ROS:   Please see the history of present illness.     All other systems reviewed and are negative.  EKGs/Labs/Other Studies Reviewed:    The following studies were reviewed today:   EKG:  EKG is  ordered today.  The ekg ordered today demonstrates normal sinus rhythm, normal ECG.  Recent Labs: No results found for requested labs within last 365 days.  Recent Lipid Panel No results found for: "CHOL", "TRIG", "HDL", "CHOLHDL", "VLDL", "LDLCALC", "LDLDIRECT"   Risk Assessment/Calculations:          Physical Exam:    VS:  BP 120/78 (BP Location: Left Arm, Patient Position: Sitting, Cuff Size: Large)   Pulse 61   Ht '5\' 8"'$  (1.727 m)   Wt 224 lb 2 oz (101.7 kg)   SpO2 96%  BMI 34.08 kg/m     Wt Readings from Last 3 Encounters:  12/13/21 224 lb 2 oz (101.7 kg)  09/05/21 224 lb 9.6 oz (101.9 kg)  06/12/21 219 lb (99.3 kg)     GEN:  Well nourished, well developed in no acute distress HEENT: Normal NECK: No JVD; No carotid bruits CARDIAC: RRR, no murmurs, rubs, gallops RESPIRATORY:  Clear to auscultation without rales, wheezing or rhonchi  ABDOMEN: Soft, non-tender, non-distended MUSCULOSKELETAL: no edema; varicose veins noted on left leg SKIN: Warm and dry NEUROLOGIC:  Alert and oriented x 3 PSYCHIATRIC:  Normal affect   ASSESSMENT:    1. Leg edema   2. Varicose veins without complication     PLAN:    In order of problems listed above:  Leg edema, currently resolved.   Echo shows normal systolic function, etiology for edema possibly venous insufficiency, vs dependent.  Continue compression stockings, leg raising while in seated position. Varicose veins in lower extremity, no evidence for DVT on venous ultrasound.  Continue conservative measures with compression stockings as currently performed.  Follow-up as needed      Medication Adjustments/Labs and Tests Ordered: Current medicines are reviewed at length with the patient today.  Concerns regarding medicines are outlined above.  No orders of the defined types were placed in this encounter.  No orders of the defined types were placed in this encounter.   Patient Instructions  Medication Instructions:   Your physician recommends that you continue on your current medications as directed. Please refer to the Current Medication list given to you today.  *If you need a refill on your cardiac medications before your next appointment, please call your pharmacy*   Follow-Up: At [facility], you and your health needs are our priority.  As part of our continuing mission to provide you with exceptional heart care, we have created designated Provider Care Teams.  These Care Teams include your primary Cardiologist (physician) and Advanced Practice Providers (APPs -  Physician Assistants and Nurse Practitioners) who all work together to provide you with the care you need, when you need it.  We recommend signing up for the patient portal called "MyChart".  Sign up information is provided on this After Visit Summary.  MyChart is used to connect with patients for Virtual Visits (Telemedicine).  Patients are able to view lab/test results, encounter notes, upcoming appointments, etc.  Non-urgent messages can be sent to your provider as well.   To learn more about what you can do with MyChart, go to [email or web address].    Your next appointment:   Follow up as needed   The format for your next  appointment:   In Person  Provider:   Kate Sable, MD    Important Information About Sugar         Signed, Kate Sable, MD  12/13/2021 9:02 AM    [place]

## 2021-12-13 NOTE — Patient Instructions (Signed)
Medication Instructions:   Your physician recommends that you continue on your current medications as directed. Please refer to the Current Medication list given to you today.  *If you need a refill on your cardiac medications before your next appointment, please call your pharmacy*   Follow-Up: At [facility], you and your health needs are our priority.  As part of our continuing mission to provide you with exceptional heart care, we have created designated Provider Care Teams.  These Care Teams include your primary Cardiologist (physician) and Advanced Practice Providers (APPs -  Physician Assistants and Nurse Practitioners) who all work together to provide you with the care you need, when you need it.  We recommend signing up for the patient portal called "MyChart".  Sign up information is provided on this After Visit Summary.  MyChart is used to connect with patients for Virtual Visits (Telemedicine).  Patients are able to view lab/test results, encounter notes, upcoming appointments, etc.  Non-urgent messages can be sent to your provider as well.   To learn more about what you can do with MyChart, go to [email or web address].    Your next appointment:   Follow up as needed   The format for your next appointment:   In Person  Provider:   Kate Sable, MD    Important Information About Sugar

## 2021-12-17 ENCOUNTER — Other Ambulatory Visit: Payer: Self-pay

## 2021-12-17 DIAGNOSIS — N952 Postmenopausal atrophic vaginitis: Secondary | ICD-10-CM | POA: Diagnosis not present

## 2021-12-17 DIAGNOSIS — L900 Lichen sclerosus et atrophicus: Secondary | ICD-10-CM | POA: Diagnosis not present

## 2021-12-17 MED ORDER — CLOBETASOL PROPIONATE 0.05 % EX OINT
TOPICAL_OINTMENT | CUTANEOUS | 2 refills | Status: AC
  Filled 2021-12-17: qty 30, 30d supply, fill #0
  Filled 2022-06-15: qty 30, 30d supply, fill #1

## 2021-12-17 MED ORDER — ESTRADIOL 0.1 MG/GM VA CREA
TOPICAL_CREAM | VAGINAL | 3 refills | Status: AC
  Filled 2021-12-17: qty 42.5, 90d supply, fill #0
  Filled 2022-06-15: qty 42.5, 90d supply, fill #1

## 2022-01-01 ENCOUNTER — Other Ambulatory Visit: Payer: Self-pay

## 2022-01-01 DIAGNOSIS — J069 Acute upper respiratory infection, unspecified: Secondary | ICD-10-CM | POA: Diagnosis not present

## 2022-01-01 MED ORDER — PREDNISONE 10 MG PO TABS
ORAL_TABLET | ORAL | 0 refills | Status: AC
  Filled 2022-01-01: qty 20, 8d supply, fill #0

## 2022-01-01 MED ORDER — AZITHROMYCIN 250 MG PO TABS
ORAL_TABLET | ORAL | 0 refills | Status: DC
  Filled 2022-01-01: qty 6, 5d supply, fill #0

## 2022-01-23 DIAGNOSIS — H5213 Myopia, bilateral: Secondary | ICD-10-CM | POA: Diagnosis not present

## 2022-04-10 DIAGNOSIS — N952 Postmenopausal atrophic vaginitis: Secondary | ICD-10-CM | POA: Diagnosis not present

## 2022-04-10 DIAGNOSIS — L900 Lichen sclerosus et atrophicus: Secondary | ICD-10-CM | POA: Diagnosis not present

## 2022-04-10 DIAGNOSIS — Z1231 Encounter for screening mammogram for malignant neoplasm of breast: Secondary | ICD-10-CM | POA: Diagnosis not present

## 2022-04-11 ENCOUNTER — Other Ambulatory Visit: Payer: Self-pay

## 2022-04-11 DIAGNOSIS — Z1231 Encounter for screening mammogram for malignant neoplasm of breast: Secondary | ICD-10-CM

## 2022-05-06 ENCOUNTER — Ambulatory Visit
Admission: RE | Admit: 2022-05-06 | Discharge: 2022-05-06 | Disposition: A | Discharge status: Home / Self Care | Payer: Commercial Managed Care - PPO | Source: Ambulatory Visit

## 2022-05-06 DIAGNOSIS — Z1231 Encounter for screening mammogram for malignant neoplasm of breast: Secondary | ICD-10-CM | POA: Diagnosis not present

## 2022-05-13 ENCOUNTER — Other Ambulatory Visit: Payer: Self-pay

## 2022-05-13 DIAGNOSIS — R928 Other abnormal and inconclusive findings on diagnostic imaging of breast: Secondary | ICD-10-CM

## 2022-05-13 DIAGNOSIS — N63 Unspecified lump in unspecified breast: Secondary | ICD-10-CM

## 2022-05-16 ENCOUNTER — Ambulatory Visit
Admission: RE | Admit: 2022-05-16 | Discharge: 2022-05-16 | Disposition: A | Discharge status: Home / Self Care | Payer: Commercial Managed Care - PPO | Source: Ambulatory Visit

## 2022-05-16 DIAGNOSIS — N63 Unspecified lump in unspecified breast: Secondary | ICD-10-CM

## 2022-05-16 DIAGNOSIS — R928 Other abnormal and inconclusive findings on diagnostic imaging of breast: Secondary | ICD-10-CM

## 2022-05-16 DIAGNOSIS — N6001 Solitary cyst of right breast: Secondary | ICD-10-CM | POA: Diagnosis not present

## 2022-06-15 ENCOUNTER — Other Ambulatory Visit: Payer: Self-pay

## 2022-06-15 DIAGNOSIS — S339XXA Sprain of unspecified parts of lumbar spine and pelvis, initial encounter: Secondary | ICD-10-CM | POA: Diagnosis not present

## 2022-06-16 ENCOUNTER — Other Ambulatory Visit: Payer: Self-pay

## 2022-06-16 MED ORDER — EPINEPHRINE 0.3 MG/0.3ML IJ SOAJ
0.3000 mg | Freq: Once | INTRAMUSCULAR | 0 refills | Status: AC
  Filled 2022-06-16: qty 2, 30d supply, fill #0

## 2022-06-18 ENCOUNTER — Other Ambulatory Visit: Payer: Self-pay

## 2022-06-18 DIAGNOSIS — M545 Low back pain, unspecified: Secondary | ICD-10-CM | POA: Diagnosis not present

## 2022-06-18 DIAGNOSIS — M47816 Spondylosis without myelopathy or radiculopathy, lumbar region: Secondary | ICD-10-CM | POA: Diagnosis not present

## 2022-06-18 DIAGNOSIS — M533 Sacrococcygeal disorders, not elsewhere classified: Secondary | ICD-10-CM | POA: Diagnosis not present

## 2022-06-18 DIAGNOSIS — M47896 Other spondylosis, lumbar region: Secondary | ICD-10-CM | POA: Diagnosis not present

## 2022-06-18 MED ORDER — METHYLPREDNISOLONE 4 MG PO TBPK
ORAL_TABLET | ORAL | 0 refills | Status: DC
  Filled 2022-06-18: qty 21, 6d supply, fill #0

## 2022-06-19 ENCOUNTER — Ambulatory Visit: Payer: 59 | Admitting: Urology

## 2022-06-19 DIAGNOSIS — M5418 Radiculopathy, sacral and sacrococcygeal region: Secondary | ICD-10-CM | POA: Diagnosis not present

## 2022-06-19 DIAGNOSIS — M2560 Stiffness of unspecified joint, not elsewhere classified: Secondary | ICD-10-CM | POA: Diagnosis not present

## 2022-06-19 DIAGNOSIS — M9903 Segmental and somatic dysfunction of lumbar region: Secondary | ICD-10-CM | POA: Diagnosis not present

## 2022-06-19 DIAGNOSIS — M545 Low back pain, unspecified: Secondary | ICD-10-CM | POA: Diagnosis not present

## 2022-06-19 DIAGNOSIS — M5416 Radiculopathy, lumbar region: Secondary | ICD-10-CM | POA: Diagnosis not present

## 2022-06-19 DIAGNOSIS — M9904 Segmental and somatic dysfunction of sacral region: Secondary | ICD-10-CM | POA: Diagnosis not present

## 2022-06-20 DIAGNOSIS — M5416 Radiculopathy, lumbar region: Secondary | ICD-10-CM | POA: Diagnosis not present

## 2022-06-20 DIAGNOSIS — M9904 Segmental and somatic dysfunction of sacral region: Secondary | ICD-10-CM | POA: Diagnosis not present

## 2022-06-20 DIAGNOSIS — M5418 Radiculopathy, sacral and sacrococcygeal region: Secondary | ICD-10-CM | POA: Diagnosis not present

## 2022-06-20 DIAGNOSIS — M9903 Segmental and somatic dysfunction of lumbar region: Secondary | ICD-10-CM | POA: Diagnosis not present

## 2022-06-23 DIAGNOSIS — M5418 Radiculopathy, sacral and sacrococcygeal region: Secondary | ICD-10-CM | POA: Diagnosis not present

## 2022-06-23 DIAGNOSIS — M9904 Segmental and somatic dysfunction of sacral region: Secondary | ICD-10-CM | POA: Diagnosis not present

## 2022-06-23 DIAGNOSIS — M9903 Segmental and somatic dysfunction of lumbar region: Secondary | ICD-10-CM | POA: Diagnosis not present

## 2022-06-23 DIAGNOSIS — M5416 Radiculopathy, lumbar region: Secondary | ICD-10-CM | POA: Diagnosis not present

## 2022-06-24 ENCOUNTER — Other Ambulatory Visit: Payer: Self-pay

## 2022-06-24 MED ORDER — CYCLOBENZAPRINE HCL 10 MG PO TABS
10.0000 mg | ORAL_TABLET | Freq: Three times a day (TID) | ORAL | 0 refills | Status: DC
  Filled 2022-06-24: qty 15, 5d supply, fill #0

## 2022-06-25 DIAGNOSIS — M5416 Radiculopathy, lumbar region: Secondary | ICD-10-CM | POA: Diagnosis not present

## 2022-06-25 DIAGNOSIS — M9904 Segmental and somatic dysfunction of sacral region: Secondary | ICD-10-CM | POA: Diagnosis not present

## 2022-06-25 DIAGNOSIS — M5418 Radiculopathy, sacral and sacrococcygeal region: Secondary | ICD-10-CM | POA: Diagnosis not present

## 2022-06-25 DIAGNOSIS — M9903 Segmental and somatic dysfunction of lumbar region: Secondary | ICD-10-CM | POA: Diagnosis not present

## 2022-06-27 DIAGNOSIS — M9904 Segmental and somatic dysfunction of sacral region: Secondary | ICD-10-CM | POA: Diagnosis not present

## 2022-06-27 DIAGNOSIS — M5416 Radiculopathy, lumbar region: Secondary | ICD-10-CM | POA: Diagnosis not present

## 2022-06-27 DIAGNOSIS — M9903 Segmental and somatic dysfunction of lumbar region: Secondary | ICD-10-CM | POA: Diagnosis not present

## 2022-06-27 DIAGNOSIS — M5418 Radiculopathy, sacral and sacrococcygeal region: Secondary | ICD-10-CM | POA: Diagnosis not present

## 2022-06-30 DIAGNOSIS — M9903 Segmental and somatic dysfunction of lumbar region: Secondary | ICD-10-CM | POA: Diagnosis not present

## 2022-06-30 DIAGNOSIS — M5418 Radiculopathy, sacral and sacrococcygeal region: Secondary | ICD-10-CM | POA: Diagnosis not present

## 2022-06-30 DIAGNOSIS — M9904 Segmental and somatic dysfunction of sacral region: Secondary | ICD-10-CM | POA: Diagnosis not present

## 2022-06-30 DIAGNOSIS — M5416 Radiculopathy, lumbar region: Secondary | ICD-10-CM | POA: Diagnosis not present

## 2022-07-01 ENCOUNTER — Other Ambulatory Visit: Payer: Self-pay

## 2022-07-01 MED ORDER — NYSTATIN 100000 UNIT/ML MT SUSP
5.0000 mL | Freq: Four times a day (QID) | OROMUCOSAL | 4 refills | Status: DC
  Filled 2022-07-01: qty 200, 10d supply, fill #0

## 2022-07-02 ENCOUNTER — Other Ambulatory Visit: Payer: Self-pay

## 2022-07-02 DIAGNOSIS — M9903 Segmental and somatic dysfunction of lumbar region: Secondary | ICD-10-CM | POA: Diagnosis not present

## 2022-07-02 DIAGNOSIS — M545 Low back pain, unspecified: Secondary | ICD-10-CM | POA: Diagnosis not present

## 2022-07-02 DIAGNOSIS — M5418 Radiculopathy, sacral and sacrococcygeal region: Secondary | ICD-10-CM | POA: Diagnosis not present

## 2022-07-02 DIAGNOSIS — M9904 Segmental and somatic dysfunction of sacral region: Secondary | ICD-10-CM | POA: Diagnosis not present

## 2022-07-02 DIAGNOSIS — M5416 Radiculopathy, lumbar region: Secondary | ICD-10-CM | POA: Diagnosis not present

## 2022-07-02 DIAGNOSIS — M533 Sacrococcygeal disorders, not elsewhere classified: Secondary | ICD-10-CM | POA: Diagnosis not present

## 2022-07-02 MED ORDER — DICLOFENAC SODIUM 75 MG PO TBEC
75.0000 mg | DELAYED_RELEASE_TABLET | Freq: Two times a day (BID) | ORAL | 0 refills | Status: DC
  Filled 2022-07-02: qty 30, 15d supply, fill #0

## 2022-07-02 MED ORDER — TRAMADOL HCL 50 MG PO TABS
50.0000 mg | ORAL_TABLET | Freq: Every evening | ORAL | 0 refills | Status: DC
  Filled 2022-07-02: qty 10, 10d supply, fill #0

## 2022-07-04 DIAGNOSIS — M533 Sacrococcygeal disorders, not elsewhere classified: Secondary | ICD-10-CM | POA: Diagnosis not present

## 2022-07-07 DIAGNOSIS — M5418 Radiculopathy, sacral and sacrococcygeal region: Secondary | ICD-10-CM | POA: Diagnosis not present

## 2022-07-07 DIAGNOSIS — M9904 Segmental and somatic dysfunction of sacral region: Secondary | ICD-10-CM | POA: Diagnosis not present

## 2022-07-07 DIAGNOSIS — M9903 Segmental and somatic dysfunction of lumbar region: Secondary | ICD-10-CM | POA: Diagnosis not present

## 2022-07-07 DIAGNOSIS — M5416 Radiculopathy, lumbar region: Secondary | ICD-10-CM | POA: Diagnosis not present

## 2022-07-09 DIAGNOSIS — M5418 Radiculopathy, sacral and sacrococcygeal region: Secondary | ICD-10-CM | POA: Diagnosis not present

## 2022-07-09 DIAGNOSIS — M5416 Radiculopathy, lumbar region: Secondary | ICD-10-CM | POA: Diagnosis not present

## 2022-07-09 DIAGNOSIS — M9903 Segmental and somatic dysfunction of lumbar region: Secondary | ICD-10-CM | POA: Diagnosis not present

## 2022-07-09 DIAGNOSIS — M9904 Segmental and somatic dysfunction of sacral region: Secondary | ICD-10-CM | POA: Diagnosis not present

## 2022-07-11 DIAGNOSIS — M5418 Radiculopathy, sacral and sacrococcygeal region: Secondary | ICD-10-CM | POA: Diagnosis not present

## 2022-07-11 DIAGNOSIS — M9903 Segmental and somatic dysfunction of lumbar region: Secondary | ICD-10-CM | POA: Diagnosis not present

## 2022-07-11 DIAGNOSIS — M9904 Segmental and somatic dysfunction of sacral region: Secondary | ICD-10-CM | POA: Diagnosis not present

## 2022-07-11 DIAGNOSIS — M5416 Radiculopathy, lumbar region: Secondary | ICD-10-CM | POA: Diagnosis not present

## 2022-07-14 DIAGNOSIS — M5418 Radiculopathy, sacral and sacrococcygeal region: Secondary | ICD-10-CM | POA: Diagnosis not present

## 2022-07-14 DIAGNOSIS — M9903 Segmental and somatic dysfunction of lumbar region: Secondary | ICD-10-CM | POA: Diagnosis not present

## 2022-07-14 DIAGNOSIS — M9904 Segmental and somatic dysfunction of sacral region: Secondary | ICD-10-CM | POA: Diagnosis not present

## 2022-07-14 DIAGNOSIS — M5416 Radiculopathy, lumbar region: Secondary | ICD-10-CM | POA: Diagnosis not present

## 2022-07-16 DIAGNOSIS — M5416 Radiculopathy, lumbar region: Secondary | ICD-10-CM | POA: Diagnosis not present

## 2022-07-16 DIAGNOSIS — M9903 Segmental and somatic dysfunction of lumbar region: Secondary | ICD-10-CM | POA: Diagnosis not present

## 2022-07-16 DIAGNOSIS — M5418 Radiculopathy, sacral and sacrococcygeal region: Secondary | ICD-10-CM | POA: Diagnosis not present

## 2022-07-16 DIAGNOSIS — M9904 Segmental and somatic dysfunction of sacral region: Secondary | ICD-10-CM | POA: Diagnosis not present

## 2022-07-21 DIAGNOSIS — M9903 Segmental and somatic dysfunction of lumbar region: Secondary | ICD-10-CM | POA: Diagnosis not present

## 2022-07-21 DIAGNOSIS — M9904 Segmental and somatic dysfunction of sacral region: Secondary | ICD-10-CM | POA: Diagnosis not present

## 2022-07-21 DIAGNOSIS — M5418 Radiculopathy, sacral and sacrococcygeal region: Secondary | ICD-10-CM | POA: Diagnosis not present

## 2022-07-21 DIAGNOSIS — M5416 Radiculopathy, lumbar region: Secondary | ICD-10-CM | POA: Diagnosis not present

## 2022-07-23 ENCOUNTER — Other Ambulatory Visit: Payer: Self-pay

## 2022-07-23 DIAGNOSIS — M545 Low back pain, unspecified: Secondary | ICD-10-CM | POA: Diagnosis not present

## 2022-07-23 DIAGNOSIS — M9903 Segmental and somatic dysfunction of lumbar region: Secondary | ICD-10-CM | POA: Diagnosis not present

## 2022-07-23 DIAGNOSIS — M533 Sacrococcygeal disorders, not elsewhere classified: Secondary | ICD-10-CM | POA: Diagnosis not present

## 2022-07-23 DIAGNOSIS — M5416 Radiculopathy, lumbar region: Secondary | ICD-10-CM | POA: Diagnosis not present

## 2022-07-23 DIAGNOSIS — M5418 Radiculopathy, sacral and sacrococcygeal region: Secondary | ICD-10-CM | POA: Diagnosis not present

## 2022-07-23 DIAGNOSIS — M9904 Segmental and somatic dysfunction of sacral region: Secondary | ICD-10-CM | POA: Diagnosis not present

## 2022-07-23 MED ORDER — DICLOFENAC SODIUM 75 MG PO TBEC
75.0000 mg | DELAYED_RELEASE_TABLET | Freq: Two times a day (BID) | ORAL | 0 refills | Status: DC
  Filled 2022-07-23: qty 30, 15d supply, fill #0

## 2022-07-28 IMAGING — US US ABDOMEN LIMITED RUQ/ASCITES
1 series · 14 of 25 positions shown · non-contrast
Comparison: CT abdomen/pelvis 06/09/18

CLINICAL DATA: Follow-up liver lesions seen on CT.

EXAM:
ULTRASOUND ABDOMEN LIMITED RIGHT UPPER QUADRANT

[Series 1: us abdomen limited ruq/ascites · 0.25mm/px · 55 acquisitions, 14 frames shown]
[im 1/55]
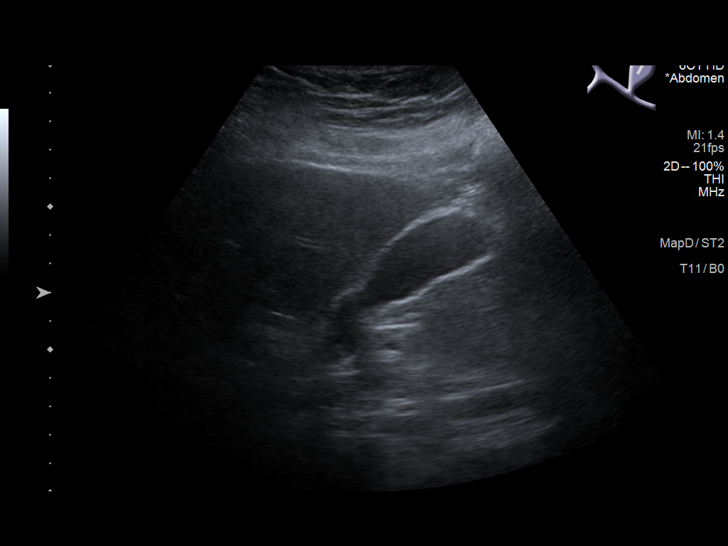
[im 5/55]
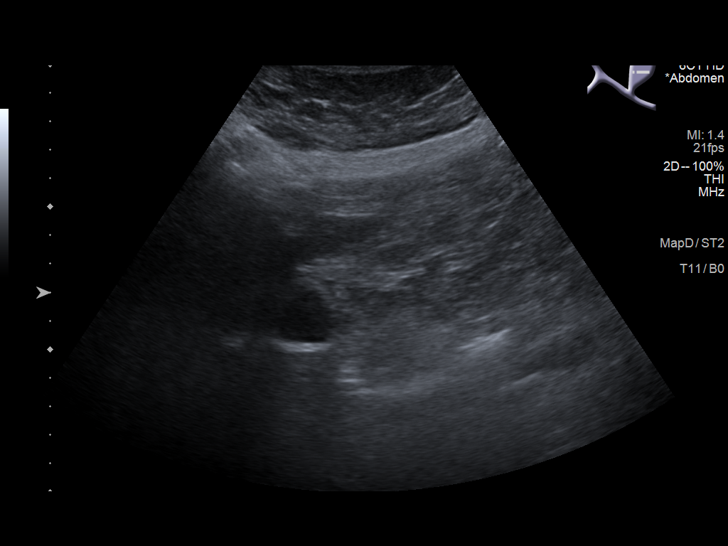
[im 10/55]
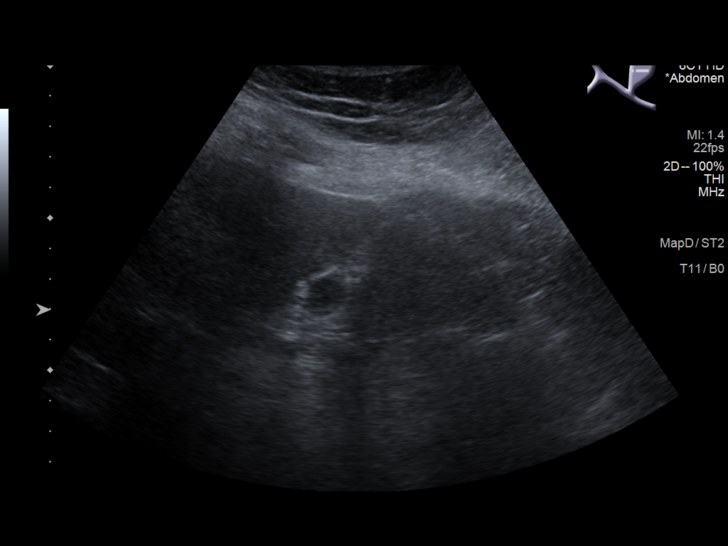
[im 14/55]
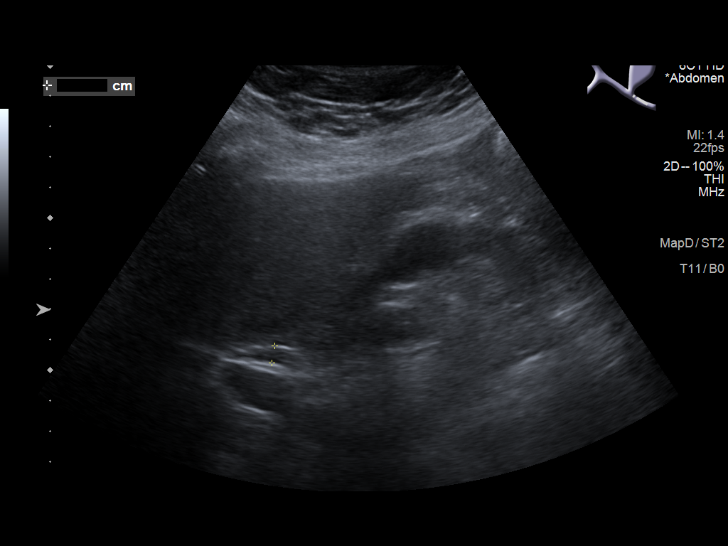
[im 19/55]
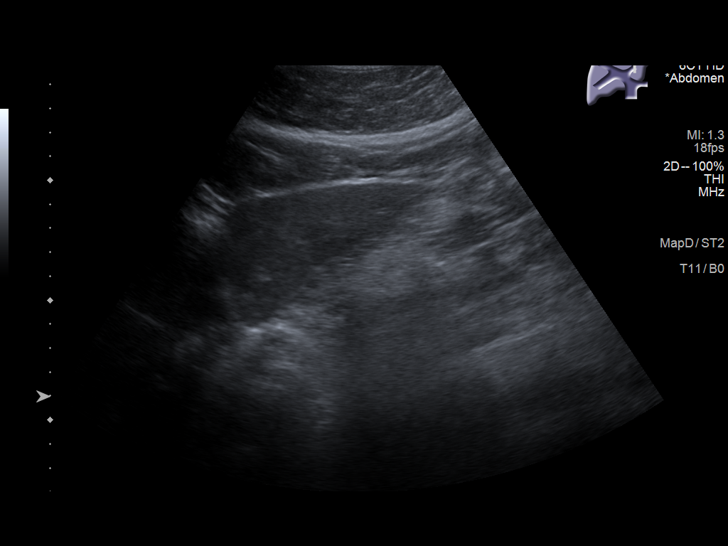
[im 21/55]
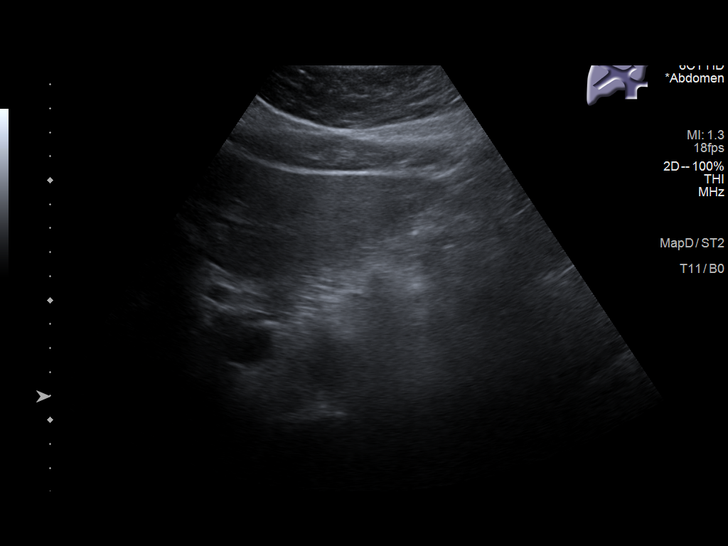
[im 25/55]
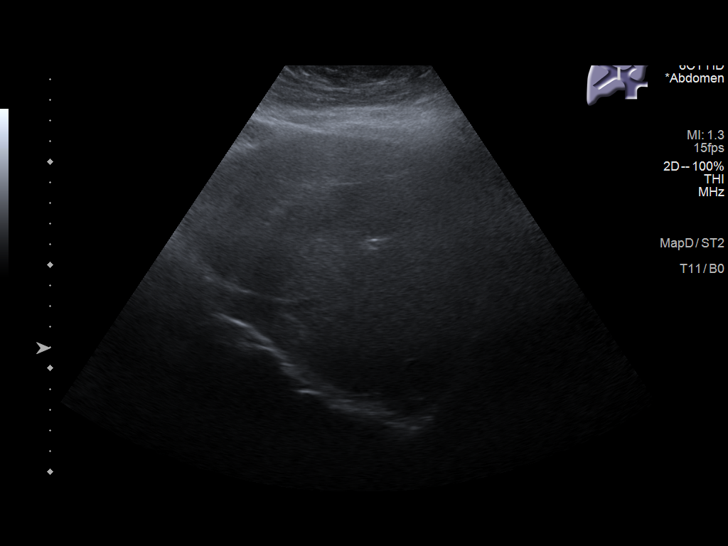
[im 30/55]
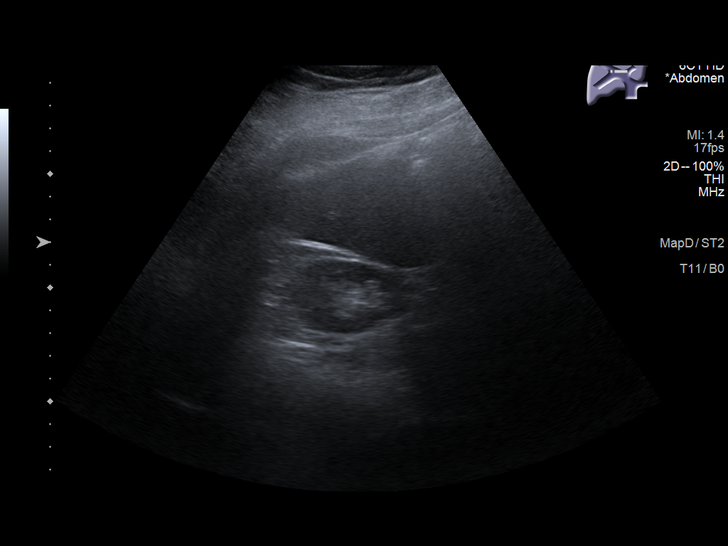
[im 34/55]
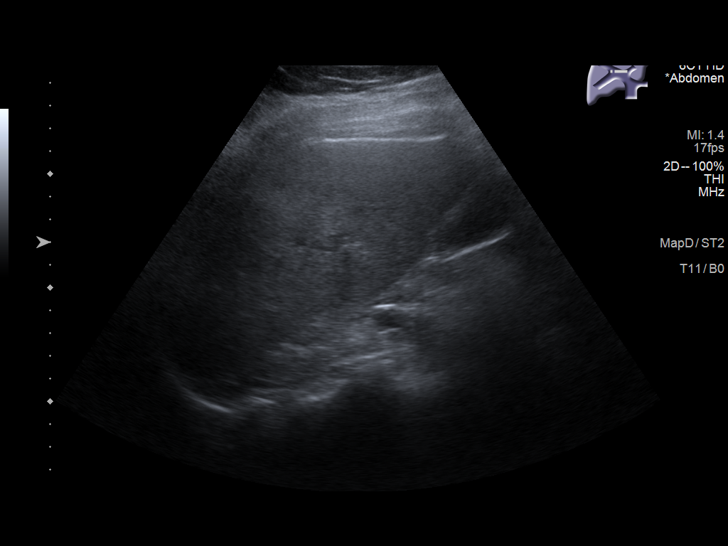
[im 37/55]
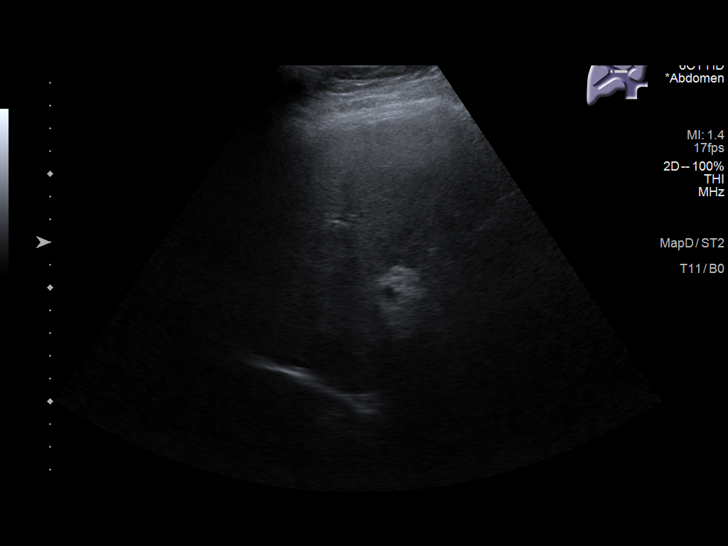
[im 41/55]
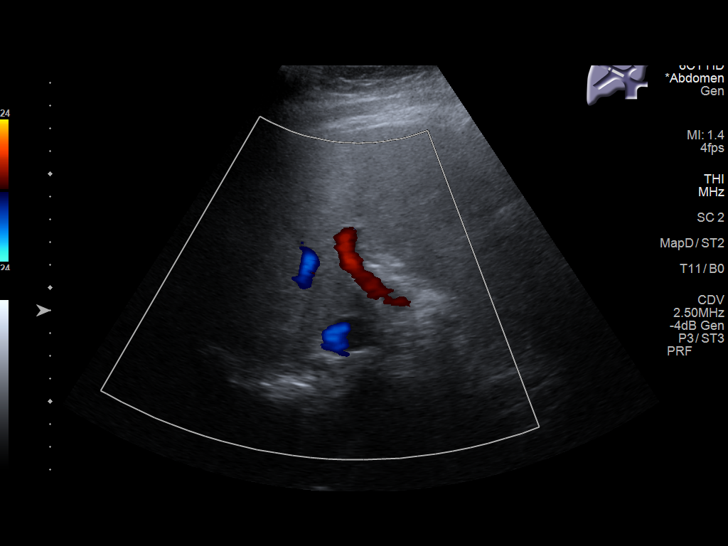
[im 46/55]
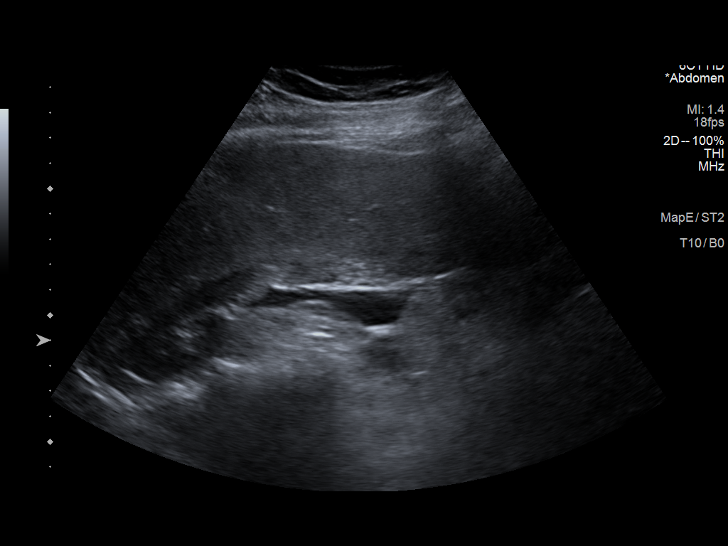
[im 50/55]
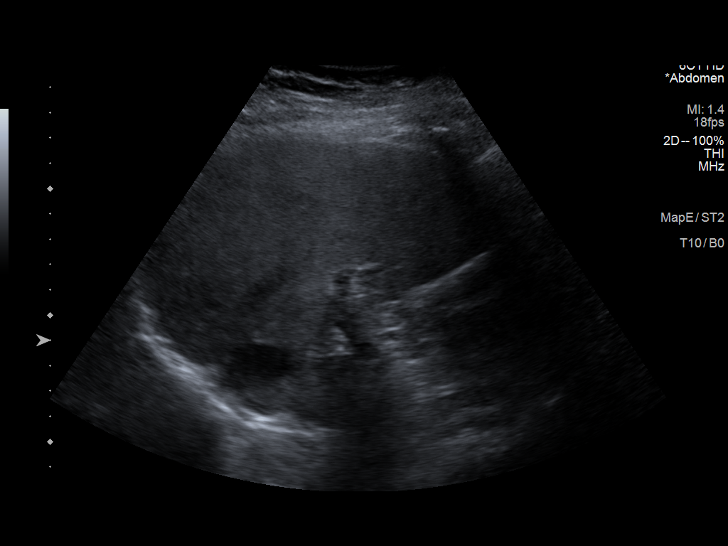
[im 55/55]
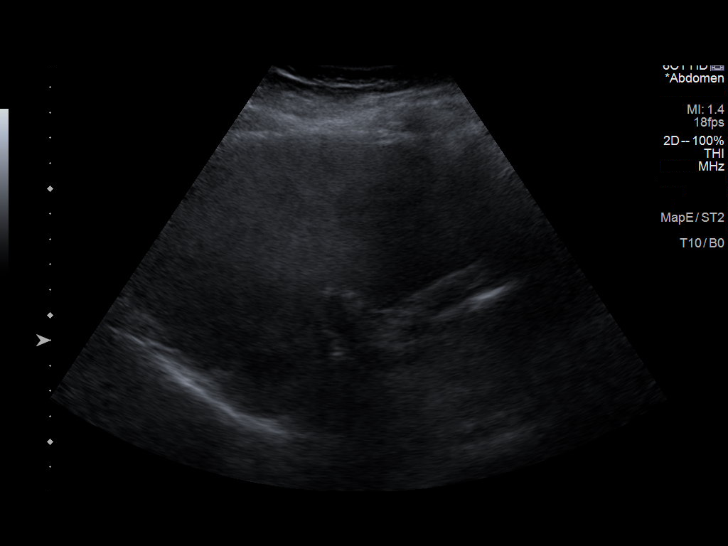

[14 of 25 positions shown; findings below may reference images not displayed]

FINDINGS: Gallbladder:

No gallstones or wall thickening visualized. No sonographic Murphy
sign noted by sonographer.

Common bile duct:

Diameter: 5mm.

Liver:

No focal lesion identified. Increased parenchymal echogenicity.
Portal vein is patent on color Doppler imaging with normal direction
of blood flow towards the liver.

Other: None.
IMPRESSION: Hepatic steatosis. Please note limited evaluation for focal hepatic
masses in a patient with hepatic steatosis due to decreased
penetration of the acoustic ultrasound waves. Recommend MRI liver
protocol for further evaluation.

## 2022-07-29 ENCOUNTER — Other Ambulatory Visit: Payer: Self-pay | Admitting: *Deleted

## 2022-07-29 DIAGNOSIS — M5418 Radiculopathy, sacral and sacrococcygeal region: Secondary | ICD-10-CM | POA: Diagnosis not present

## 2022-07-29 DIAGNOSIS — M9904 Segmental and somatic dysfunction of sacral region: Secondary | ICD-10-CM | POA: Diagnosis not present

## 2022-07-29 DIAGNOSIS — N200 Calculus of kidney: Secondary | ICD-10-CM

## 2022-07-29 DIAGNOSIS — M5416 Radiculopathy, lumbar region: Secondary | ICD-10-CM | POA: Diagnosis not present

## 2022-07-29 DIAGNOSIS — M9903 Segmental and somatic dysfunction of lumbar region: Secondary | ICD-10-CM | POA: Diagnosis not present

## 2022-07-30 ENCOUNTER — Ambulatory Visit: Payer: 59 | Admitting: Urology

## 2022-08-05 DIAGNOSIS — M9903 Segmental and somatic dysfunction of lumbar region: Secondary | ICD-10-CM | POA: Diagnosis not present

## 2022-08-05 DIAGNOSIS — M5418 Radiculopathy, sacral and sacrococcygeal region: Secondary | ICD-10-CM | POA: Diagnosis not present

## 2022-08-05 DIAGNOSIS — M9904 Segmental and somatic dysfunction of sacral region: Secondary | ICD-10-CM | POA: Diagnosis not present

## 2022-08-05 DIAGNOSIS — M5416 Radiculopathy, lumbar region: Secondary | ICD-10-CM | POA: Diagnosis not present

## 2022-08-06 ENCOUNTER — Other Ambulatory Visit: Payer: Self-pay | Admitting: Urology

## 2022-08-06 ENCOUNTER — Ambulatory Visit
Admission: RE | Admit: 2022-08-06 | Discharge: 2022-08-06 | Disposition: A | Discharge status: Home / Self Care | Payer: Commercial Managed Care - PPO | Attending: Urology | Admitting: Urology

## 2022-08-06 ENCOUNTER — Other Ambulatory Visit: Payer: Self-pay

## 2022-08-06 ENCOUNTER — Encounter: Payer: Self-pay | Admitting: Urology

## 2022-08-06 ENCOUNTER — Ambulatory Visit: Payer: Commercial Managed Care - PPO | Admitting: Urology

## 2022-08-06 ENCOUNTER — Ambulatory Visit
Admission: RE | Admit: 2022-08-06 | Discharge: 2022-08-06 | Disposition: A | Discharge status: Home / Self Care | Payer: Commercial Managed Care - PPO | Source: Ambulatory Visit | Attending: Urology | Admitting: Urology

## 2022-08-06 VITALS — BP 147/77 | HR 71 | Ht 68.0 in | Wt 221.4 lb

## 2022-08-06 DIAGNOSIS — N3281 Overactive bladder: Secondary | ICD-10-CM

## 2022-08-06 DIAGNOSIS — N200 Calculus of kidney: Secondary | ICD-10-CM | POA: Insufficient documentation

## 2022-08-06 MED ORDER — OXYBUTYNIN CHLORIDE ER 10 MG PO TB24
10.0000 mg | ORAL_TABLET | Freq: Every day | ORAL | 11 refills | Status: AC
  Filled 2022-08-06: qty 30, 30d supply, fill #0
  Filled 2022-10-03: qty 30, 30d supply, fill #1
  Filled 2022-11-18: qty 30, 30d supply, fill #2

## 2022-08-06 NOTE — Progress Notes (Signed)
   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  She had a microscopic hematuria work-up that showed bilateral renal stones, and cystoscopy was normal.  She had considered shockwave lithotripsy of the larger left 7 mm lower pole stone, but ultimately canceled this procedure and opted for observation.  She denies any major issues over the last year, and has not passed any kidney stones.  She denies any gross hematuria.  She does report some new bladder symptoms with urgency, frequency, and occasional urge incontinence and is interested in trying medications for this.  I personally viewed and interpreted her KUB today that shows a stable 5 mm right midpole stone, interval growth of left lower pole stone ~78mm with 2 additional left sided stones in the mid pole and upper pole.  We reviewed options again at length including observation, shockwave lithotripsy, or ureteroscopy, and the risks and benefits were reviewed.  At this point she is interested in definitive treatment and would like to pursue left-sided ureteroscopy, laser lithotripsy, stent placement.  We discussed that overactive bladder (OAB) is not a disease, but is a symptom complex that is generally not life-threatening.  Symptoms typically include urinary urgency, frequency, and urge incontinence.  There are numerous treatment options, however there are risks and benefits with both medical and surgical management.  First-line treatment is behavioral therapies including bladder training, pelvic floor muscle training, and fluid management.  Second line treatments include oral antimuscarinics(Ditropan er, Trospium) and beta-3 agonist (Mybetriq). There is typically a period of medication trial (4-8 weeks) to find the optimal  therapy and dosing. If symptoms are bothersome despite the above management, third line options include intra-detrusor botox, peripheral tibial nerve stimulation (PTNS), and interstim (SNS). These are more invasive treatments with higher side effect profile, but may improve quality of life for patients with severe OAB symptoms.   Trial of oxybutynin 10 mg XL for OAB symptoms Schedule left ureteroscopy, laser lithotripsy, stent placement   Sondra Come, MD  [facility] [address], [address] [place], [place] [address] [phone]

## 2022-08-06 NOTE — Patient Instructions (Signed)
Laser Therapy for Kidney Stones Laser therapy for kidney stones is a procedure to break up rock-like masses that form inside the kidneys (kidney stones). It is done using a device that beams a strong light (laser) on the kidney stones. This breaks the stones up into small pieces. These small pieces may leave your body when you pee (urinate) or may be taken out during the procedure.  You may need laser therapy if you have kidney stones that are painful or that are stopping you from being able to pee. Tell a health care provider about: Any allergies you have. All medicines you are taking, including vitamins, herbs, eye drops, creams, and over-the-counter medicines. Any problems you or family members have had with anesthesia. Any bleeding problems you have. Any surgeries you have had. Any medical conditions you have. Whether you are pregnant or may be pregnant. What are the risks? Your health care provider will talk with you about risks. These may include: Infection. Bleeding. Allergic reactions to medicines. Damage to: The part of your body that drains pee (urine) from the bladder (urethra). The bladder. The tube that connects the bladder to the kidneys (ureter). Urinary tract infection (UTI). Urethral stricture. This is when the urethra is narrowed by scarring. Trouble peeing. Blockage of the kidney. This may be caused by a piece of kidney stone. What happens before the procedure? When to stop eating and drinking Follow instructions from your provider about what you may eat and drink. These may include: 8 hours before the procedure Stop eating most foods. Do not eat meat, fried foods, or fatty foods. Eat only light foods, such as toast or crackers. All liquids are okay except energy drinks and alcohol. 6 hours before the procedure Stop eating. Drink only clear liquids, such as water, clear fruit juice, black coffee, plain tea, and sports drinks. Do not drink energy drinks or  alcohol. 2 hours before the procedure Stop drinking all liquids. You may be allowed to take medicines with small sips of water. If you do not follow your provider's instructions, your procedure may be delayed or canceled. Medicines Ask your provider about: Changing or stopping your regular medicines. These include any diabetes medicines or blood thinners you take. Taking medicines such as aspirin and ibuprofen. These medicines can thin your blood. Do not take them unless your provider tells you to. Taking over-the-counter medicines, vitamins, herbs, and supplements. Tests You may have a physical exam before the procedure. You may also have tests done. These may include: Imaging tests. Blood or pee tests. Surgery safety Ask your provider: How your surgery site will be marked. What steps will be taken to help prevent infection. These steps may include: Removing hair at the surgery site. Washing skin with a soap that kills germs. Taking antibiotics. General instructions Do not use any products that contain nicotine or tobacco for at least 4 weeks before the procedure. These products include cigarettes, chewing tobacco, and vaping devices, such as e-cigarettes. If you need help quitting, ask your provider. If you will be going home right after the procedure, plan to have a responsible adult: Take you home from the hospital or clinic. You will not be allowed to drive. Care for you for the time you are told. What happens during the procedure?  An IV will be inserted into one of your veins. You will be given: A sedative. This helps you relax. Anesthesia. This keeps you from feeling pain. It will make you fall asleep for surgery. A tool   with a camera on the end (ureteroscope) will be put into your urethra. It will be moved through your bladder to your kidney. It will send pictures to a screen in the operating room. This will show what parts of your kidney need to be treated. A tube will be  put through the ureteroscope. It will be moved into your kidney. The laser device will be put into your kidney through the tube. The laser will be used to break up the kidney stones. A tool with a tiny wire basket may be put through the tube into your kidney. This can help remove the small pieces of the kidney stone. A small mesh tube (stent) may be placed to allow your kidney to drain. The tube and ureteroscope will be taken out at the end of the surgery. The procedure may vary among providers and hospitals. What happens after the procedure? Your blood pressure, heart rate, breathing rate, and blood oxygen level will be monitored until you leave the hospital or clinic. If you had a stent placed, it may have a string that will be secured to your skin. This helps your provider remove the stent. You may be given a strainer to collect any stone pieces that you pass in your pee. Your provider may have these tested. This information is not intended to replace advice given to you by your health care provider. Make sure you discuss any questions you have with your health care provider. Document Revised: 11/22/2021 Document Reviewed: 11/22/2021 Elsevier Patient Education  2023 Elsevier Inc.  Ureteral Stent Implantation Ureteral stent implantation is a procedure to insert (implant) a flexible, soft, plastic tube (stent) into a ureter. Ureters are the tubelike parts of the body that drain urine from the kidneys. A ureteral stent may be implanted: After a procedure to remove a blockage from the ureter (ureterolysis or pyeloplasty). To open the flow of urine when a blockage is caused by a kidney stone, tumor, blood clot, or infection. You have two ureters, one on each side of your body. The ureters connect your kidneys to your bladder. The stent is placed so that one end is in your kidney, and one end is in your bladder. The stent supports the ureter while it heals and helps to drain urine. The stent is  usually taken out after your ureter has healed. Depending on your condition, you may have a stent for just a few weeks, or you may have a long-term stent that will need to be replaced every few months. Tell a health care provider about: Any allergies you have. All medicines you are taking, including vitamins, herbs, eye drops, creams, and over-the-counter medicines. Any problems you or family members have had with anesthetic medicines. Any bleeding problems you have. Any surgeries you have had. Any medical conditions you have. Whether you are pregnant or may be pregnant. What are the risks? Generally, this is a safe procedure. However, problems may occur, including: Infection. Bleeding. Allergic reactions to medicines. Damage to nearby structures or organs, such as tearing (perforation) of the ureter. Movement of the stent away from where it is placed during surgery (migration). Buildup of a crust or hard coating (encrustation) on the stent. This happens when bacteria in the body form crystals on the stent, causing it to weaken. What happens before the procedure? Medicines Ask your health care provider about: Changing or stopping your regular medicines. These include any diabetes medicines or blood thinners you take. Taking medicines such as aspirin and ibuprofen. These   medicines can thin your blood. Do not take them unless your health care provider tells you to. Taking over-the-counter medicines, vitamins, herbs, and supplements. When to stop eating and drinking Follow instructions from your health care provider about what you may eat and drink. These may include: 8 hours before your procedure Stop eating most foods. Do not eat meat, fried foods, or fatty foods. Eat only light foods, such as toast or crackers. All liquids are okay except energy drinks and alcohol. 6 hours before your procedure Stop eating. Drink only clear liquids, such as water, clear fruit juice, black coffee, plain  tea, and sports drinks. Do not drink energy drinks or alcohol. 2 hours before your procedure Stop drinking all liquids. You may be allowed to take medicines with small sips of water. If you do not follow your health care provider's instructions, your procedure may be delayed or canceled. General instructions Do not use any products that contain nicotine or tobacco for at least 4 weeks before the procedure. These products include cigarettes, chewing tobacco, and vaping devices, such as e-cigarettes. If you need help quitting, ask your health care provider. You may have an exam or testing, such as imaging or blood tests. If you will be going home right after the procedure, plan to have a responsible adult: Take you home from the hospital or clinic. You will not be allowed to drive. Care for you for the time you are told. Ask your health care provider what steps will be taken to help prevent infection. These steps may include: Removing hair at the surgery site. Washing skin with a soap that kills germs. Taking antibiotic medicine. What happens during the procedure? An IV will be inserted into one of your veins. You may be given: A medicine to help you relax (sedative). A medicine to make you fall asleep (general anesthetic). A thin, tube-shaped instrument with a light and tiny camera at the end (cystoscope) will be inserted into your urethra. The urethra is the part of your body that drains urine from the bladder. The urethra opens at the end of the penis or in front of the vaginal opening. The cystoscope will be passed into your bladder. Guided imagery using X-ray may be used to pass a thin wire (guide wire) through your bladder and into your ureter. This wire is used to guide the stent into your ureter. The stent will be inserted into your ureter. The guide wire and the cystoscope will be removed. A thin, flexible tube (catheter) may be put through your urethra so that one end is in your  bladder. This helps to drain urine from your bladder. The procedure may vary among hospitals and health care providers. What happens after the procedure? Your blood pressure, heart rate, breathing rate, and blood oxygen level will be monitored until you leave the hospital or clinic. You may continue to get medicine and fluids through an IV. You may have some soreness or pain in your abdomen and urethra. You may be given medicines for this. You will be encouraged to get up and walk around as soon as you can. You may have a catheter draining your urine. Summary Ureteral stent implantation is a procedure to insert a flexible, soft, plastic tube (stent) into a ureter. You may have a stent implanted to support the ureter while it heals after a procedure or to open the flow of urine if there is a blockage. You may have a stent for just a few weeks, or you   may have a long-term stent that will need to be replaced every few months. Follow instructions from your health care provider about taking medicines and about eating and drinking before the procedure. This information is not intended to replace advice given to you by your health care provider. Make sure you discuss any questions you have with your health care provider. Document Revised: 04/29/2021 Document Reviewed: 04/29/2021 Elsevier Patient Education  2023 Elsevier Inc.  

## 2022-08-06 NOTE — Progress Notes (Signed)
Surgical Physician Order Form [facility] [place]  * Scheduling expectation : Next Available patient preference  *Length of Case: 1 hour  *Clearance needed: no  *Anticoagulation Instructions: Okay to continue  *Aspirin Instructions: Ok to continue Aspirin  *Post-op visit Date/Instructions:   tbd  *Diagnosis: Left Nephrolithiasis  *Procedure: left Ureteroscopy w/laser lithotripsy & stent placement ([address]109)   Additional orders: N/A  -Admit type: OUTpatient  -Anesthesia: General  -VTE Prophylaxis Standing Order SCD's       Other:   -Standing Lab Orders Per Anesthesia    Lab other: UA&Urine Culture  -Standing Test orders EKG/Chest x-ray per Anesthesia       Test other:   - Medications:  Cipro 400mg  IV  -Other orders:  N/A

## 2022-08-07 ENCOUNTER — Other Ambulatory Visit (HOSPITAL_BASED_OUTPATIENT_CLINIC_OR_DEPARTMENT_OTHER): Payer: Self-pay

## 2022-08-08 ENCOUNTER — Other Ambulatory Visit: Payer: Self-pay

## 2022-08-12 DIAGNOSIS — M5418 Radiculopathy, sacral and sacrococcygeal region: Secondary | ICD-10-CM | POA: Diagnosis not present

## 2022-08-12 DIAGNOSIS — M5416 Radiculopathy, lumbar region: Secondary | ICD-10-CM | POA: Diagnosis not present

## 2022-08-12 DIAGNOSIS — M9904 Segmental and somatic dysfunction of sacral region: Secondary | ICD-10-CM | POA: Diagnosis not present

## 2022-08-12 DIAGNOSIS — M9903 Segmental and somatic dysfunction of lumbar region: Secondary | ICD-10-CM | POA: Diagnosis not present

## 2022-08-12 NOTE — Progress Notes (Signed)
Spoke with pt. Pt. Is taking a new job upcoming. She will call back with a date that may work for her in the upcoming weeks. Will schedule when she calls back.

## 2022-08-19 DIAGNOSIS — M9904 Segmental and somatic dysfunction of sacral region: Secondary | ICD-10-CM | POA: Diagnosis not present

## 2022-08-19 DIAGNOSIS — M5416 Radiculopathy, lumbar region: Secondary | ICD-10-CM | POA: Diagnosis not present

## 2022-08-19 DIAGNOSIS — M9903 Segmental and somatic dysfunction of lumbar region: Secondary | ICD-10-CM | POA: Diagnosis not present

## 2022-08-19 DIAGNOSIS — M5418 Radiculopathy, sacral and sacrococcygeal region: Secondary | ICD-10-CM | POA: Diagnosis not present

## 2022-10-03 ENCOUNTER — Other Ambulatory Visit: Payer: Self-pay

## 2022-10-03 ENCOUNTER — Other Ambulatory Visit (HOSPITAL_COMMUNITY): Payer: Self-pay

## 2022-10-06 ENCOUNTER — Other Ambulatory Visit: Payer: Self-pay

## 2022-11-18 ENCOUNTER — Other Ambulatory Visit: Payer: Self-pay

## 2022-12-19 ENCOUNTER — Other Ambulatory Visit: Payer: Self-pay

## 2022-12-22 ENCOUNTER — Other Ambulatory Visit: Payer: Self-pay

## 2023-01-08 ENCOUNTER — Other Ambulatory Visit (HOSPITAL_COMMUNITY): Payer: Self-pay

## 2023-08-10 ENCOUNTER — Other Ambulatory Visit: Payer: Self-pay | Admitting: Family Medicine

## 2023-08-10 DIAGNOSIS — Z1231 Encounter for screening mammogram for malignant neoplasm of breast: Secondary | ICD-10-CM

## 2023-11-25 ENCOUNTER — Telehealth: Payer: Self-pay

## 2023-11-25 ENCOUNTER — Other Ambulatory Visit: Payer: Self-pay

## 2023-11-25 DIAGNOSIS — N200 Calculus of kidney: Secondary | ICD-10-CM

## 2023-11-25 NOTE — Telephone Encounter (Signed)
 Pt called in and would like to be seen tomorrow. She has seen Dr. Brunetta in the past for stones and thinks she is actively passing them now. She is having increased pain and blood in her urine. I was able to schedule her an appt for tomorrow with Sam PA.

## 2023-11-26 ENCOUNTER — Ambulatory Visit
Admission: RE | Admit: 2023-11-26 | Discharge: 2023-11-26 | Disposition: A | Discharge status: Home / Self Care | Attending: Physician Assistant | Admitting: Physician Assistant

## 2023-11-26 ENCOUNTER — Ambulatory Visit
Admission: RE | Admit: 2023-11-26 | Discharge: 2023-11-26 | Disposition: A | Discharge status: Home / Self Care | Source: Ambulatory Visit | Attending: Physician Assistant | Admitting: Physician Assistant

## 2023-11-26 ENCOUNTER — Ambulatory Visit: Admitting: Physician Assistant

## 2023-11-26 VITALS — BP 148/82 | HR 62 | Ht 66.0 in | Wt 203.0 lb

## 2023-11-26 DIAGNOSIS — N200 Calculus of kidney: Secondary | ICD-10-CM | POA: Insufficient documentation

## 2023-11-26 DIAGNOSIS — Z87442 Personal history of urinary calculi: Secondary | ICD-10-CM

## 2023-11-26 DIAGNOSIS — R109 Unspecified abdominal pain: Secondary | ICD-10-CM | POA: Diagnosis not present

## 2023-11-26 LAB — MICROSCOPIC EXAMINATION: Epithelial Cells (non renal): >10 /HPF — AB (ref 0–10)

## 2023-11-26 LAB — URINALYSIS, COMPLETE
Bilirubin, UA: NEGATIVE
Glucose, UA: NEGATIVE
Ketones, UA: NEGATIVE
Leukocytes,UA: NEGATIVE
Nitrite, UA: NEGATIVE
Protein,UA: NEGATIVE
Specific Gravity, UA: 1.02 (ref 1.005–1.030)
Urobilinogen, Ur: 4 mg/dL — ABNORMAL HIGH (ref 0.2–1.0)
pH, UA: 6 (ref 5.0–7.5)

## 2023-11-26 MED ORDER — OXYCODONE-ACETAMINOPHEN 5-325 MG PO TABS
1.0000 | ORAL_TABLET | Freq: Four times a day (QID) | ORAL | 0 refills | Status: AC | PRN
Start: 2023-11-26 — End: 2023-12-01

## 2023-11-26 NOTE — Progress Notes (Signed)
 11/26/2023 2:30 PM   Clarita HERO Slezak 1960-08-25 [id number]  CC: Chief Complaint  Patient presents with   Establish Care   Nephrolithiasis   HPI: Jaylina Ramdass Mandujano is a 63 y.o. female with PMH nephrolithiasis who presents today for evaluation of a possible acute stone episode.   Today she reports an approximate 1 week history of gross hematuria, left flank and LLQ pain, and chills.  She has not seen anything pass.  Her pain has been so severe that she has needed hydrocodone to manage it.  She is rather comfortable today.  KUB today with no radiopaque ureteral stones.  In-office UA today positive for trace lysed blood and 4.0 EU/DL urobilinogen; urine microscopy with 3-10 RBCs/HPF, >10 epithelial cells/hpf, and moderate bacteria.  PMH: Past Medical History:  Diagnosis Date   Dysplastic nevus 05/24/2008   Upper mid back - moderate   Dysplastic nevus 10/31/2020   right abdomen, severe. Excised 12/26/2020, margins free   Kidney stone     Surgical History: No past surgical history on file.  Home Medications:  Allergies as of 11/26/2023       Reactions   Augmentin [amoxicillin-pot Clavulanate] Anaphylaxis   Sulfa Antibiotics Swelling        Medication List        Accurate as of November 26, 2023  2:30 PM. If you have any questions, ask your nurse or doctor.          STOP taking these medications    diclofenac  75 MG EC tablet Commonly known as: VOLTAREN        TAKE these medications    clobetasol  ointment 0.05 % Commonly known as: TEMOVATE  Apply fingertip amount topically to area nightly for 4-6 weeks, then every other night for 4-6 weeks, and then twice weekly for maintenance. (Apply fingertip amount topically to area nightly for 4-6 weeks, then every other night for 4-6 weeks, and then twice weekly for maintenance.)   estradiol  0.1 MG/GM vaginal cream Commonly known as: ESTRACE  Apply fingertip amount vaginally and around urethra nightly for 2 weeks and then  twice weekly for maintenance.   oxybutynin  10 MG 24 hr tablet Commonly known as: DITROPAN -XL Take 1 tablet (10 mg total) by mouth daily.        Allergies:  Allergies  Allergen Reactions   Augmentin [Amoxicillin-Pot Clavulanate] Anaphylaxis   Sulfa Antibiotics Swelling    Family History: Family History  Problem Relation Age of Onset   Healthy Mother    Alzheimer's disease Father     Social History:   reports that she has never smoked. She has never used smokeless tobacco. She reports that she does not drink alcohol and does not use drugs.  Physical Exam: BP (!) 148/82 (BP Location: Left Arm, Patient Position: Sitting, Cuff Size: Normal)   Pulse 62   Ht 5' 6 (1.676 m)   Wt 203 lb (92.1 kg)   SpO2 97%   BMI 32.77 kg/m   Constitutional:  Alert and oriented, no acute distress, nontoxic appearing HEENT: [place], AT Cardiovascular: No clubbing, cyanosis, or edema Respiratory: Normal respiratory effort, no increased work of breathing Skin: No rashes, bruises or suspicious lesions Neurologic: Grossly intact, no focal deficits, moving all 4 extremities Psychiatric: Normal mood and affect  Laboratory Data: See epic  Pertinent Imaging: KUB, 11/26/2023: See epic  I personally reviewed the images referenced above and note no radiopaque ureteral stones.  Assessment & Plan:   1. Flank pain with history of urolithiasis (Primary) Extensive stone  history, now with left flank pain, LLQ pain, and microscopic hematuria.  I do not see any radiopaque stones on KUB and recommended proceeding with CT stone study for further evaluation.  She agrees.  For baffling reasons, her insurance is not wanting to cover a CT scan.  I am sending in Percocet for symptom control while we appeal with her insurance. - Urinalysis, Complete - CT RENAL STONE STUDY; Future - oxyCODONE -acetaminophen  (PERCOCET/ROXICET) 5-325 MG tablet; Take 1-2 tablets by mouth every 6 (six) hours as needed for up to 5 days for  severe pain (pain score 7-10).  Dispense: 10 tablet; Refill: 0   Return for Will call with CT scan scheduling info.  Lucie Hones, PA-C  [facility] [place] [address] [place], [place] [address] [phone]

## 2023-11-26 NOTE — H&P (View-Only) (Signed)
 11/26/2023 2:30 PM   Margaret Sellers 1960-08-25 [id number]  CC: Chief Complaint  Patient presents with   Establish Care   Nephrolithiasis   HPI: Margaret Sellers is a 63 y.o. female with PMH nephrolithiasis who presents today for evaluation of a possible acute stone episode.   Today she reports an approximate 1 week history of gross hematuria, left flank and LLQ pain, and chills.  She has not seen anything pass.  Her pain has been so severe that she has needed hydrocodone to manage it.  She is rather comfortable today.  KUB today with no radiopaque ureteral stones.  In-office UA today positive for trace lysed blood and 4.0 EU/DL urobilinogen; urine microscopy with 3-10 RBCs/HPF, >10 epithelial cells/hpf, and moderate bacteria.  PMH: Past Medical History:  Diagnosis Date   Dysplastic nevus 05/24/2008   Upper mid back - moderate   Dysplastic nevus 10/31/2020   right abdomen, severe. Excised 12/26/2020, margins free   Kidney stone     Surgical History: No past surgical history on file.  Home Medications:  Allergies as of 11/26/2023       Reactions   Augmentin [amoxicillin-pot Clavulanate] Anaphylaxis   Sulfa Antibiotics Swelling        Medication List        Accurate as of November 26, 2023  2:30 PM. If you have any questions, ask your nurse or doctor.          STOP taking these medications    diclofenac  75 MG EC tablet Commonly known as: VOLTAREN        TAKE these medications    clobetasol  ointment 0.05 % Commonly known as: TEMOVATE  Apply fingertip amount topically to area nightly for 4-6 weeks, then every other night for 4-6 weeks, and then twice weekly for maintenance. (Apply fingertip amount topically to area nightly for 4-6 weeks, then every other night for 4-6 weeks, and then twice weekly for maintenance.)   estradiol  0.1 MG/GM vaginal cream Commonly known as: ESTRACE  Apply fingertip amount vaginally and around urethra nightly for 2 weeks and then  twice weekly for maintenance.   oxybutynin  10 MG 24 hr tablet Commonly known as: DITROPAN -XL Take 1 tablet (10 mg total) by mouth daily.        Allergies:  Allergies  Allergen Reactions   Augmentin [Amoxicillin-Pot Clavulanate] Anaphylaxis   Sulfa Antibiotics Swelling    Family History: Family History  Problem Relation Age of Onset   Healthy Mother    Alzheimer's disease Father     Social History:   reports that she has never smoked. She has never used smokeless tobacco. She reports that she does not drink alcohol and does not use drugs.  Physical Exam: BP (!) 148/82 (BP Location: Left Arm, Patient Position: Sitting, Cuff Size: Normal)   Pulse 62   Ht 5' 6 (1.676 m)   Wt 203 lb (92.1 kg)   SpO2 97%   BMI 32.77 kg/m   Constitutional:  Alert and oriented, no acute distress, nontoxic appearing HEENT: [place], AT Cardiovascular: No clubbing, cyanosis, or edema Respiratory: Normal respiratory effort, no increased work of breathing Skin: No rashes, bruises or suspicious lesions Neurologic: Grossly intact, no focal deficits, moving all 4 extremities Psychiatric: Normal mood and affect  Laboratory Data: See epic  Pertinent Imaging: KUB, 11/26/2023: See epic  I personally reviewed the images referenced above and note no radiopaque ureteral stones.  Assessment & Plan:   1. Flank pain with history of urolithiasis (Primary) Extensive stone  history, now with left flank pain, LLQ pain, and microscopic hematuria.  I do not see any radiopaque stones on KUB and recommended proceeding with CT stone study for further evaluation.  She agrees.  For baffling reasons, her insurance is not wanting to cover a CT scan.  I am sending in Percocet for symptom control while we appeal with her insurance. - Urinalysis, Complete - CT RENAL STONE STUDY; Future - oxyCODONE -acetaminophen  (PERCOCET/ROXICET) 5-325 MG tablet; Take 1-2 tablets by mouth every 6 (six) hours as needed for up to 5 days for  severe pain (pain score 7-10).  Dispense: 10 tablet; Refill: 0   Return for Will call with CT scan scheduling info.  Lucie Hones, PA-C  [facility] [place] [address] [place], [place] [address] [phone]

## 2023-11-27 ENCOUNTER — Telehealth: Payer: Self-pay

## 2023-11-27 ENCOUNTER — Other Ambulatory Visit: Payer: Self-pay

## 2023-11-27 DIAGNOSIS — N200 Calculus of kidney: Secondary | ICD-10-CM

## 2023-11-27 MED ORDER — TAMSULOSIN HCL 0.4 MG PO CAPS
0.4000 mg | ORAL_CAPSULE | Freq: Every day | ORAL | 0 refills | Status: AC
  Filled 2023-11-27: qty 30, 30d supply, fill #0

## 2023-11-27 NOTE — Telephone Encounter (Signed)
 Patient called with complaints that she felt unsupported with her appointment. I tired to let her know that we are doing what we can to get her CT scan aproved through insurance to see where her stones. Are. I let her know that she did have pains meds called in and she voiced she had picked them up already and her pain was manageable at the moment with Tylenol . I asked PA if we can add Flomax  to her regimen and see if that will help until we know more from a CT.

## 2023-12-01 ENCOUNTER — Telehealth: Payer: Self-pay

## 2023-12-01 NOTE — Telephone Encounter (Signed)
 Per SV advised to call pt to confirm she is aware to schedule her CT.   LM on cell and home number.  Informed pt that her insurance has approved her CT and she can call and schedule at her convenience. Number given. Also sent my chart message.   Centralized scheduling has called x 2 and LM advising to call and schedule CT.

## 2023-12-09 ENCOUNTER — Ambulatory Visit
Admission: RE | Admit: 2023-12-09 | Discharge: 2023-12-09 | Disposition: A | Discharge status: Home / Self Care | Source: Ambulatory Visit | Attending: Physician Assistant | Admitting: Physician Assistant

## 2023-12-09 DIAGNOSIS — R109 Unspecified abdominal pain: Secondary | ICD-10-CM | POA: Diagnosis present

## 2023-12-09 DIAGNOSIS — Z87442 Personal history of urinary calculi: Secondary | ICD-10-CM | POA: Insufficient documentation

## 2023-12-10 ENCOUNTER — Other Ambulatory Visit: Payer: Self-pay

## 2023-12-10 ENCOUNTER — Other Ambulatory Visit: Payer: Self-pay | Admitting: Physician Assistant

## 2023-12-10 ENCOUNTER — Telehealth: Payer: Self-pay | Admitting: Physician Assistant

## 2023-12-10 DIAGNOSIS — N201 Calculus of ureter: Secondary | ICD-10-CM

## 2023-12-10 MED ORDER — ONDANSETRON 4 MG PO TBDP
4.0000 mg | ORAL_TABLET | Freq: Three times a day (TID) | ORAL | 0 refills | Status: AC | PRN
  Filled 2023-12-10: qty 20, 29d supply, fill #0

## 2023-12-10 NOTE — Progress Notes (Signed)
 I spoke with the patient via telephone to discuss her CT stone study results.  CT shows an obstructing 6 mm proximal right ureteral stone.  She describes soreness and some new dysuria, but no fevers.  She works in [employer] and is unable to provide a urine specimen today, but she is able to get a sterile urine cup and will have her husband drop off a specimen tomorrow morning.  I have put in orders for UA and culture.  Barring any urinary infection, we discussed treatment options including trial of passage, ESWL, and ureteroscopy.  Stone measures up to 1100 HU in density, approximate skin to stone distance 12 cm.  She has never required definitive management before and has only spontaneously passed her stones, though they have never been this large.  She is open to either treatment option depending on timing.  Will discuss timing with our surgical team and reach back out to her when I know more.

## 2023-12-10 NOTE — Progress Notes (Unsigned)
 Surgical Physician Order Form [facility] [place]  Dr. Francisca * Scheduling expectation : 12/14/2023  *Length of Case:   *Clearance needed: no  *Anticoagulation Instructions: N/A  *Aspirin Instructions: N/A  *Post-op visit Date/Instructions:  TBD  *Diagnosis: Right Ureteral Stone  *Procedure: right Ureteroscopy w/laser lithotripsy & stent placement (47643)   Additional orders: N/A  -Admit type: OUTpatient  -Anesthesia: General  -VTE Prophylaxis Standing Order SCD's       Other:   -Standing Lab Orders Per Anesthesia    Lab other: None  -Standing Test orders EKG/Chest x-ray per Anesthesia       Test other:   - Medications:  Ceftriaxone(Rocephin) 1gm IV  -Other orders:  N/A

## 2023-12-10 NOTE — Telephone Encounter (Signed)
 Patient called regarding her CT results that she viewed on mychart. She has some concerns and wanted to speak with someone. I advised patient that after provider has reviewed results, she would be contacted. Patient verbalized understanding.

## 2023-12-10 NOTE — Telephone Encounter (Signed)
Returned call, see separate note

## 2023-12-10 NOTE — Progress Notes (Signed)
 Spoke with patient; she agrees to proceed with right URS/LL/stent placement with Dr. Francisca on Monday 12/14/2023. She is on a GLP-1, last dose 12/05/2023. I advised her to skip her dose this weekend so she will be off this for >7 days by the time of surgery.  She is scheduled for lab visit with us  tomorrow morning for preop UA/Cx. She has pain meds on hand; sending in Zofran  for symptom control. OR orders in.

## 2023-12-11 ENCOUNTER — Other Ambulatory Visit: Payer: Self-pay

## 2023-12-11 ENCOUNTER — Other Ambulatory Visit
Admission: RE | Admit: 2023-12-11 | Discharge: 2023-12-11 | Disposition: A | Discharge status: Home / Self Care | Source: Ambulatory Visit | Attending: Physician Assistant | Admitting: Physician Assistant

## 2023-12-11 ENCOUNTER — Telehealth: Payer: Self-pay

## 2023-12-11 ENCOUNTER — Encounter
Admission: RE | Admit: 2023-12-11 | Discharge: 2023-12-11 | Disposition: A | Discharge status: Home / Self Care | Source: Ambulatory Visit | Attending: Urology | Admitting: Urology

## 2023-12-11 ENCOUNTER — Other Ambulatory Visit

## 2023-12-11 ENCOUNTER — Ambulatory Visit: Payer: Self-pay | Admitting: Physician Assistant

## 2023-12-11 DIAGNOSIS — Z01812 Encounter for preprocedural laboratory examination: Secondary | ICD-10-CM | POA: Insufficient documentation

## 2023-12-11 DIAGNOSIS — N200 Calculus of kidney: Secondary | ICD-10-CM

## 2023-12-11 DIAGNOSIS — N201 Calculus of ureter: Secondary | ICD-10-CM

## 2023-12-11 DIAGNOSIS — Z87442 Personal history of urinary calculi: Secondary | ICD-10-CM | POA: Diagnosis not present

## 2023-12-11 DIAGNOSIS — R310 Gross hematuria: Secondary | ICD-10-CM | POA: Diagnosis not present

## 2023-12-11 DIAGNOSIS — F419 Anxiety disorder, unspecified: Secondary | ICD-10-CM | POA: Diagnosis not present

## 2023-12-11 DIAGNOSIS — N132 Hydronephrosis with renal and ureteral calculous obstruction: Secondary | ICD-10-CM | POA: Diagnosis not present

## 2023-12-11 HISTORY — DX: Personal history of urinary calculi: Z87.442

## 2023-12-11 HISTORY — DX: Pneumonia, unspecified organism: J18.9

## 2023-12-11 HISTORY — DX: Other specified postprocedural states: Z98.890

## 2023-12-11 HISTORY — DX: Anemia, unspecified: D64.9

## 2023-12-11 HISTORY — DX: Other complications of anesthesia, initial encounter: T88.59XA

## 2023-12-11 LAB — URINALYSIS, COMPLETE
Bilirubin, UA: NEGATIVE
Glucose, UA: NEGATIVE
Ketones, UA: NEGATIVE
Leukocytes,UA: NEGATIVE
Nitrite, UA: NEGATIVE
Protein,UA: NEGATIVE
Specific Gravity, UA: <1.005 — ABNORMAL LOW (ref 1.005–1.030)
Urobilinogen, Ur: 2 mg/dL — ABNORMAL HIGH (ref 0.2–1.0)
pH, UA: 6 (ref 5.0–7.5)

## 2023-12-11 LAB — MICROSCOPIC EXAMINATION

## 2023-12-11 MED ORDER — FLUCONAZOLE 150 MG PO TABS
150.0000 mg | ORAL_TABLET | Freq: Once | ORAL | 0 refills | Status: AC
  Filled 2023-12-11: qty 1, 1d supply, fill #0

## 2023-12-11 MED ORDER — DOXYCYCLINE HYCLATE 100 MG PO CAPS
100.0000 mg | ORAL_CAPSULE | Freq: Two times a day (BID) | ORAL | 0 refills | Status: AC
  Filled 2023-12-11: qty 10, 5d supply, fill #0

## 2023-12-11 NOTE — Progress Notes (Signed)
   [facility] Surgical Posting Form  Surgery Date: Date: 12/14/2023  Surgeon: Dr. Redell Burnet, MD  Inpt ( No  )   Outpt (Yes)   Obs ( No  )   Diagnosis: N20.1 Right Ureteral Stone  -CPT: [phone]  Surgery: Right Ureteroscopy with Laser Lithotripsy and Stent Placement  Stop Anticoagulations: No  Cardiac/Medical/Pulmonary Clearance needed: No  *Orders entered into EPIC  Date: 12/11/23   *Case booked in [place]  Date: 12/11/23  *Notified pt of Surgery: Date: 12/11/23  PRE-OP UA & CX: Yes, will obtain in clinic on 12/11/2023  *Placed into Prior Authorization Work [place] Date: 12/11/23  Assistant/laser/rep:No

## 2023-12-11 NOTE — Addendum Note (Signed)
 Addended by: RUTHER SETTER A on: 12/11/2023 08:18 AM   Modules accepted: Orders

## 2023-12-11 NOTE — Patient Instructions (Addendum)
 Your procedure is scheduled on: 12/14/23  Report to the Registration Desk on the 1st floor of the Medical Mall. To find out your arrival time, please call [phone] between 1PM - 3PM on: 12/11/23 If your arrival time is 6:00 am, do not arrive before that time as the Medical Mall entrance doors do not open until 6:00 am.  REMEMBER: Instructions that are not followed completely may result in serious medical risk, up to and including death; or upon the discretion of your surgeon and anesthesiologist your surgery may need to be rescheduled.  Do not eat food or drink any liquids after midnight the night before surgery.  No gum chewing or hard candies.  One week prior to surgery: Stop Anti-inflammatories (NSAIDS) such as Advil, Aleve, Ibuprofen, Motrin, Naproxen, Naprosyn and Aspirin based products such as Excedrin, Goody's Powder, BC Powder. You may continue to take Tylenol  if needed for pain up until the day of surgery.  Stop ANY OVER THE COUNTER supplements until after surgery.  ON THE DAY OF SURGERY ONLY TAKE THESE MEDICATIONS WITH SIPS OF WATER:  oxybutynin  (DITROPAN -XL)  oxyCODONE -acetaminophen  if needed   No Alcohol for 24 hours before or after surgery.  No Smoking including e-cigarettes for 24 hours before surgery.  No chewable tobacco products for at least 6 hours before surgery.  No nicotine patches on the day of surgery.  Do not use any recreational drugs for at least a week (preferably 2 weeks) before your surgery.  Please be advised that the combination of cocaine and anesthesia may have negative outcomes, up to and including death. If you test positive for cocaine, your surgery will be cancelled.  On the morning of surgery brush your teeth with toothpaste and water, you may rinse your mouth with mouthwash if you wish. Do not swallow any toothpaste or mouthwash.  Do not wear jewelry, make-up, hairpins, clips or nail polish.  For welded (permanent) jewelry:  bracelets, anklets, waist bands, etc.  Please have this removed prior to surgery.  If it is not removed, there is a chance that hospital personnel will need to cut it off on the day of surgery.  Do not wear lotions, powders, or perfumes.   Do not shave body hair from the neck down 48 hours before surgery.  Contact lenses, hearing aids and dentures may not be worn into surgery.  Do not bring valuables to the hospital. [facility] is not responsible for any missing/lost belongings or valuables.   Notify your doctor if there is any change in your medical condition (cold, fever, infection).  Wear comfortable clothing (specific to your surgery type) to the hospital.  After surgery, you can help prevent lung complications by doing breathing exercises.  Take deep breaths and cough every 1-2 hours. Your doctor may order a device called an Incentive Spirometer to help you take deep breaths.  When coughing or sneezing, hold a pillow firmly against your incision with both hands. This is called "splinting." Doing this helps protect your incision. It also decreases belly discomfort.  If you are being admitted to the hospital overnight, leave your suitcase in the car. After surgery it may be brought to your room.  In case of increased patient census, it may be necessary for you, the patient, to continue your postoperative care in the Same Day Surgery department.  If you are being discharged the day of surgery, you will not be allowed to drive home. You will need a responsible individual to drive you home and  stay with you for 24 hours after surgery.   If you are taking public transportation, you will need to have a responsible individual with you.  Please call the Pre-admissions Testing Dept. at [phone] if you have any questions about these instructions.  Surgery Visitation Policy:  Patients having surgery or a procedure may have two visitors.  Children under the age of 79 must have an  adult with them who is not the patient.  Inpatient Visitation:    Visiting hours are 7 a.m. to 8 p.m. Up to four visitors are allowed at one time in a patient room. The visitors may rotate out with other people during the day.  One visitor age 9 or older may stay with the patient overnight and must be in the room by 8 p.m.   Merchandiser, retail to address health-related social needs:  [email or web address]

## 2023-12-11 NOTE — Telephone Encounter (Signed)
 Per Dr. Francisca, Patient is to be scheduled for Right Ureteroscopy with Laser Lithotripsy and Stent Placement   Margaret Sellers was contacted and possible surgical dates were discussed, Monday September 8th, 2025 was agreed upon for surgery.   Patient was directed to call [phone] between 1-3pm the day before surgery to find out surgical arrival time.  Instructions were given not to eat or drink from midnight on the night before surgery and have a driver for the day of surgery. On the surgery day patient was instructed to enter through the [facility] Mall entrance of [facility] report the Same Day Surgery desk.   Pre-Admit Testing will be in contact via phone to set up an interview with the anesthesia team to review your history and medications prior to surgery.   Reminder of this information was sent via MyChart to the patient.

## 2023-12-12 LAB — URINE CULTURE

## 2023-12-14 ENCOUNTER — Ambulatory Visit: Payer: Self-pay | Admitting: Certified Registered"

## 2023-12-14 ENCOUNTER — Other Ambulatory Visit: Payer: Self-pay

## 2023-12-14 ENCOUNTER — Ambulatory Visit
Admission: RE | Admit: 2023-12-14 | Discharge: 2023-12-14 | Disposition: A | Discharge status: Home / Self Care | Attending: Urology | Admitting: Urology

## 2023-12-14 ENCOUNTER — Encounter
Admission: RE | Disposition: A | Discharge status: Home / Self Care | Payer: Self-pay | Source: Home / Self Care | Attending: Urology

## 2023-12-14 ENCOUNTER — Encounter: Payer: Self-pay | Admitting: Urology

## 2023-12-14 ENCOUNTER — Ambulatory Visit

## 2023-12-14 DIAGNOSIS — N132 Hydronephrosis with renal and ureteral calculous obstruction: Secondary | ICD-10-CM | POA: Insufficient documentation

## 2023-12-14 DIAGNOSIS — R310 Gross hematuria: Secondary | ICD-10-CM | POA: Insufficient documentation

## 2023-12-14 DIAGNOSIS — N201 Calculus of ureter: Secondary | ICD-10-CM | POA: Diagnosis not present

## 2023-12-14 DIAGNOSIS — Z87442 Personal history of urinary calculi: Secondary | ICD-10-CM | POA: Insufficient documentation

## 2023-12-14 DIAGNOSIS — F419 Anxiety disorder, unspecified: Secondary | ICD-10-CM | POA: Insufficient documentation

## 2023-12-14 HISTORY — PX: CYSTOSCOPY/URETEROSCOPY/HOLMIUM LASER/STENT PLACEMENT: SHX6546

## 2023-12-14 SURGERY — CYSTOSCOPY/URETEROSCOPY/HOLMIUM LASER/STENT PLACEMENT
Anesthesia: General | Site: Ureter | Laterality: Right

## 2023-12-14 MED ORDER — DEXAMETHASONE SODIUM PHOSPHATE 10 MG/ML IJ SOLN
INTRAMUSCULAR | Status: DC | PRN
  Administered 2023-12-14: 10 mg via INTRAVENOUS

## 2023-12-14 MED ORDER — OXYCODONE HCL 5 MG PO TABS
5.0000 mg | ORAL_TABLET | Freq: Once | ORAL | Status: AC | PRN
  Administered 2023-12-14: 5 mg via ORAL

## 2023-12-14 MED ORDER — DROPERIDOL 2.5 MG/ML IJ SOLN
0.6250 mg | Freq: Once | INTRAMUSCULAR | Status: AC
Start: 2023-12-14 — End: 2023-12-14
  Administered 2023-12-14: 0.625 mg via INTRAVENOUS

## 2023-12-14 MED ORDER — LIDOCAINE HCL (CARDIAC) PF 100 MG/5ML IV SOSY
PREFILLED_SYRINGE | INTRAVENOUS | Status: DC | PRN
  Administered 2023-12-14: 100 mg via INTRAVENOUS

## 2023-12-14 MED ORDER — CHLORHEXIDINE GLUCONATE 0.12 % MT SOLN
OROMUCOSAL | Status: AC
  Filled 2023-12-14: qty 15

## 2023-12-14 MED ORDER — KETOROLAC TROMETHAMINE 10 MG PO TABS
10.0000 mg | ORAL_TABLET | Freq: Four times a day (QID) | ORAL | 0 refills | Status: AC | PRN
  Filled 2023-12-14: qty 8, 2d supply, fill #0

## 2023-12-14 MED ORDER — PROPOFOL 10 MG/ML IV BOLUS
INTRAVENOUS | Status: DC | PRN
  Administered 2023-12-14: 150 mg via INTRAVENOUS

## 2023-12-14 MED ORDER — IOHEXOL 180 MG/ML  SOLN
INTRAMUSCULAR | Status: DC | PRN
  Administered 2023-12-14: 10 mL

## 2023-12-14 MED ORDER — DROPERIDOL 2.5 MG/ML IJ SOLN
INTRAMUSCULAR | Status: AC
  Filled 2023-12-14: qty 2

## 2023-12-14 MED ORDER — KETOROLAC TROMETHAMINE 30 MG/ML IJ SOLN
INTRAMUSCULAR | Status: DC | PRN
  Administered 2023-12-14: 30 mg via INTRAVENOUS

## 2023-12-14 MED ORDER — ACETAMINOPHEN 10 MG/ML IV SOLN
INTRAVENOUS | Status: AC
  Filled 2023-12-14: qty 100

## 2023-12-14 MED ORDER — ACETAMINOPHEN 10 MG/ML IV SOLN
INTRAVENOUS | Status: DC | PRN
  Administered 2023-12-14: 1000 mg via INTRAVENOUS

## 2023-12-14 MED ORDER — OXYMETAZOLINE HCL 0.05 % NA SOLN
NASAL | Status: AC
  Filled 2023-12-14: qty 30

## 2023-12-14 MED ORDER — SUGAMMADEX SODIUM 200 MG/2ML IV SOLN
INTRAVENOUS | Status: DC | PRN
  Administered 2023-12-14: 200 mg via INTRAVENOUS

## 2023-12-14 MED ORDER — SODIUM CHLORIDE 0.9 % IR SOLN
Status: DC | PRN
  Administered 2023-12-14: 3000 mL

## 2023-12-14 MED ORDER — OXYCODONE HCL 5 MG/5ML PO SOLN: 5.0000 mg | Freq: Once | ORAL | Status: AC | PRN

## 2023-12-14 MED ORDER — CHLORHEXIDINE GLUCONATE 0.12 % MT SOLN: 15.0000 mL | Freq: Once | OROMUCOSAL | Status: DC

## 2023-12-14 MED ORDER — LACTATED RINGERS IV SOLN: INTRAVENOUS | Status: DC

## 2023-12-14 MED ORDER — STERILE WATER FOR IRRIGATION IR SOLN
Status: DC | PRN
  Administered 2023-12-14: 500 mL

## 2023-12-14 MED ORDER — MIDAZOLAM HCL 2 MG/2ML IJ SOLN
INTRAMUSCULAR | Status: AC
  Filled 2023-12-14: qty 2

## 2023-12-14 MED ORDER — MIDAZOLAM HCL 2 MG/2ML IJ SOLN
INTRAMUSCULAR | Status: DC | PRN
  Administered 2023-12-14: 2 mg via INTRAVENOUS

## 2023-12-14 MED ORDER — FENTANYL CITRATE (PF) 100 MCG/2ML IJ SOLN
INTRAMUSCULAR | Status: AC
  Filled 2023-12-14: qty 2

## 2023-12-14 MED ORDER — ONDANSETRON HCL 4 MG/2ML IJ SOLN
INTRAMUSCULAR | Status: DC | PRN
Start: 2023-12-14 — End: 2023-12-14
  Administered 2023-12-14 (×2): 4 mg via INTRAVENOUS

## 2023-12-14 MED ORDER — OXYCODONE HCL 5 MG PO TABS
ORAL_TABLET | ORAL | Status: AC
  Filled 2023-12-14: qty 1

## 2023-12-14 MED ORDER — ORAL CARE MOUTH RINSE: 15.0000 mL | Freq: Once | OROMUCOSAL | Status: DC

## 2023-12-14 MED ORDER — FENTANYL CITRATE (PF) 100 MCG/2ML IJ SOLN
25.0000 ug | INTRAMUSCULAR | Status: DC | PRN
  Administered 2023-12-14: 50 ug via INTRAVENOUS
  Administered 2023-12-14: 25 ug via INTRAVENOUS
  Administered 2023-12-14: 50 ug via INTRAVENOUS

## 2023-12-14 MED ORDER — SODIUM CHLORIDE 0.9 % IV SOLN
1.0000 g | INTRAVENOUS | Status: AC
  Administered 2023-12-14: 1 g via INTRAVENOUS
  Filled 2023-12-14: qty 1

## 2023-12-14 MED ORDER — GLYCOPYRROLATE 0.2 MG/ML IJ SOLN
INTRAMUSCULAR | Status: DC | PRN
  Administered 2023-12-14: .2 mg via INTRAVENOUS

## 2023-12-14 MED ORDER — ROCURONIUM BROMIDE 100 MG/10ML IV SOLN
INTRAVENOUS | Status: DC | PRN
  Administered 2023-12-14: 50 mg via INTRAVENOUS

## 2023-12-14 SURGICAL SUPPLY — 25 items
ADHESIVE MASTISOL STRL (MISCELLANEOUS) IMPLANT
BAG DRAIN SIEMENS DORNER NS (MISCELLANEOUS) ×1 IMPLANT
BAG PRESSURE INF REUSE 3000 (BAG) ×1 IMPLANT
BRUSH SCRUB EZ 4% CHG (MISCELLANEOUS) ×1 IMPLANT
CATH URET FLEX-TIP 2 LUMEN 10F (CATHETERS) IMPLANT
CATH URETL OPEN 5X70 (CATHETERS) IMPLANT
CNTNR URN SCR LID CUP LEK RST (MISCELLANEOUS) IMPLANT
DRAPE UTILITY 15X26 TOWEL STRL (DRAPES) ×1 IMPLANT
DRSG TEGADERM 2-3/8X2-3/4 SM (GAUZE/BANDAGES/DRESSINGS) IMPLANT
FIBER LASER MOSES 200 DFL (Laser) IMPLANT
FIBER LASER MOSES 365 DFL (Laser) IMPLANT
GOWN STRL REUS W/ TWL LRG LVL3 (GOWN DISPOSABLE) ×1 IMPLANT
GOWN STRL REUS W/ TWL XL LVL3 (GOWN DISPOSABLE) ×1 IMPLANT
GUIDEWIRE STR DUAL SENSOR (WIRE) ×1 IMPLANT
KIT TURNOVER CYSTO (KITS) ×1 IMPLANT
PACK CYSTO AR (MISCELLANEOUS) ×1 IMPLANT
SET CYSTO W/LG BORE CLAMP LF (SET/KITS/TRAYS/PACK) ×1 IMPLANT
SHEATH NAVIGATOR HD 12/14X36 (SHEATH) IMPLANT
SOL .9 NS 3000ML IRR UROMATIC (IV SOLUTION) ×1 IMPLANT
STENT URET 6FRX24 CONTOUR (STENTS) IMPLANT
STENT URET 6FRX26 CONTOUR (STENTS) IMPLANT
SURGILUBE 2OZ TUBE FLIPTOP (MISCELLANEOUS) ×1 IMPLANT
SYR 10ML LL (SYRINGE) ×1 IMPLANT
VALVE UROSEAL ADJ ENDO (VALVE) IMPLANT
WATER STERILE IRR 500ML POUR (IV SOLUTION) ×1 IMPLANT

## 2023-12-14 NOTE — Transfer of Care (Signed)
 Immediate Anesthesia Transfer of Care Note  Patient: Margaret Sellers  Procedure(s) Performed: CYSTOSCOPY/URETEROSCOPY/HOLMIUM LASER/STENT PLACEMENT (Right: Ureter)  Patient Location: PACU  Anesthesia Type:General  Level of Consciousness: drowsy and patient cooperative  Airway & Oxygen Therapy: Patient Spontanous Breathing and Patient connected to face mask oxygen  Post-op Assessment: Report given to RN and Post -op Vital signs reviewed and stable  Post vital signs: Reviewed and stable  Last Vitals:  Vitals Value Taken Time  BP 126/75 12/14/23 14:53  Temp 36.3 C 12/14/23 14:53  Pulse 73 12/14/23 14:57  Resp 16 12/14/23 14:57  SpO2 97 % 12/14/23 14:57  Vitals shown include unfiled device data.  Last Pain:  Vitals:   12/14/23 1453  TempSrc:   PainSc: Asleep         Complications: No notable events documented.

## 2023-12-14 NOTE — Op Note (Signed)
 Date of procedure: 12/14/23  Preoperative diagnosis:  Right ureteral stone   Postoperative diagnosis:  Same   Procedure: Cystoscopy, right ureteroscopy, laser lithotripsy, right retrograde pyelogram with intraoperative interpretation, right ureteral stent placement  Surgeon: Redell Burnet, MD  Anesthesia: General  Complications: None  Intraoperative findings:  Normal cystoscopy, uncomplicated dusting of 7 mm right proximal ureteral stone with stent placement  EBL: None  Specimens: None  Drains: Right 6 French by 24 cm ureteral stent  Indication: Margaret Sellers is a 63 y.o. patient with 7 mm right proximal ureteral stone and ongoing renal colic who opted for ureteroscopy.  After reviewing the management options for treatment, they elected to proceed with the above surgical procedure(s). We have discussed the potential benefits and risks of the procedure, side effects of the proposed treatment, the likelihood of the patient achieving the goals of the procedure, and any potential problems that might occur during the procedure or recuperation. Informed consent has been obtained.  Description of procedure:  The patient was taken to the operating room and general anesthesia was induced. SCDs were placed for DVT prophylaxis. The patient was placed in the dorsal lithotomy position, prepped and draped in the usual sterile fashion, and preoperative antibiotics were administered. A preoperative time-out was performed.   A 21 French rigid cystoscope was used to intubate the urethra and thorough cystoscopy was performed.  There was a moderate size cystocele.  No suspicious bladder lesions, ureteral orifices orthotopic bilaterally.  A sensor wire was advanced into the right ureteral orifice and passed up to the right kidney under fluoroscopic vision.  A semirigid long ureteroscope was advanced alongside the wire and there was no evidence of stone in the distal or mid ureter.  I then advanced a  digital single-channel flexible ureteroscope over the wire up to the stone in the proximal ureter.  The stone was dusted on settings of 1.0 J and 10 Hz.  Majority of the stone was pushed back into the kidney where it was then dusted on settings of 0.5 J and 80 Hz.  Stone was calcium oxalate appearing and dense.  A retrograde pyelogram from the proximal ureter showed no extravasation or filling defects.  A sensor wire was replaced through the scope and the scope removed with no evidence of ureteral injury or residual fragments.  The rigid cystoscope was backloaded over the wire and a 6 [place] by 24 cm ureteral stent was placed uneventfully with a curl in the kidney as well as in the bladder.  The bladder was drained and this concluded procedure  Disposition: Stable to PACU  Plan: Schedule stent removal in clinic this Friday  Redell Burnet, MD

## 2023-12-14 NOTE — Interval H&P Note (Signed)
 History and Physical Interval Note:  12/14/2023 1:54 PM  Margaret Sellers  has presented today for surgery, with the diagnosis of Right Ureteral Stone.  The various methods of treatment have been discussed with the patient and family. After consideration of risks, benefits and other options for treatment, the patient has consented to  Procedure(s): CYSTOSCOPY/URETEROSCOPY/HOLMIUM LASER/STENT PLACEMENT (Right) as a surgical intervention.  The patient's history has been reviewed, patient examined, no change in status, stable for surgery.  I have reviewed the patient's chart and labs.  Questions were answered to the patient's satisfaction.     Redell JAYSON Burnet

## 2023-12-14 NOTE — Anesthesia Preprocedure Evaluation (Signed)
 Anesthesia Evaluation  Patient identified by MRN, date of birth, ID band Patient awake    Reviewed: Allergy & Precautions, NPO status , Patient's Chart, lab work & pertinent test results  Airway Mallampati: III  TM Distance: >3 FB Neck ROM: full    Dental  (+) Chipped   Pulmonary neg pulmonary ROS   Pulmonary exam normal        Cardiovascular negative cardio ROS Normal cardiovascular exam     Neuro/Psych  PSYCHIATRIC DISORDERS Anxiety     negative neurological ROS     GI/Hepatic negative GI ROS, Neg liver ROS,,,  Endo/Other  negative endocrine ROS    Renal/GU      Musculoskeletal   Abdominal   Peds  Hematology negative hematology ROS (+)   Anesthesia Other Findings Past Medical History: No date: Anemia No date: Complication of anesthesia     Comment:  woke up during surgery - colon surgery 05/24/2008: Dysplastic nevus     Comment:  Upper mid back - moderate 10/31/2020: Dysplastic nevus     Comment:  right abdomen, severe. Excised 12/26/2020, margins free No date: History of kidney stones No date: Kidney stone No date: Pneumonia No date: PONV (postoperative nausea and vomiting)  Past Surgical History: No date: ABDOMINAL HYSTERECTOMY No date: APPENDECTOMY No date: COLON SURGERY     Comment:  resection of colon No date: TUBAL LIGATION  BMI    Body Mass Index: 32.12 kg/m      Reproductive/Obstetrics negative OB ROS                              Anesthesia Physical Anesthesia Plan  ASA: 2  Anesthesia Plan: General ETT   Post-op Pain Management:    Induction: Intravenous  PONV Risk Score and Plan: 3 and Ondansetron , Dexamethasone  and Midazolam   Airway Management Planned: Oral ETT  Additional Equipment:   Intra-op Plan:   Post-operative Plan: Extubation in OR  Informed Consent: I have reviewed the patients History and Physical, chart, labs and discussed the  procedure including the risks, benefits and alternatives for the proposed anesthesia with the patient or authorized representative who has indicated his/her understanding and acceptance.     Dental Advisory Given  Plan Discussed with: Anesthesiologist, CRNA and Surgeon  Anesthesia Plan Comments: (Patient consented for risks of anesthesia including but not limited to:  - adverse reactions to medications - damage to eyes, teeth, lips or other oral mucosa - nerve damage due to positioning  - sore throat or hoarseness - Damage to heart, brain, nerves, lungs, other parts of body or loss of life  Patient voiced understanding and assent.)        Anesthesia Quick Evaluation

## 2023-12-14 NOTE — Anesthesia Procedure Notes (Signed)
 Procedure Name: Intubation Date/Time: 12/14/2023 2:15 PM  Performed by: Ledora Duncan, CRNAPre-anesthesia Checklist: Patient identified, Emergency Drugs available, Suction available and Patient being monitored Patient Re-evaluated:Patient Re-evaluated prior to induction Oxygen Delivery Method: Circle system utilized Preoxygenation: Pre-oxygenation with 100% oxygen Induction Type: IV induction Ventilation: Mask ventilation without difficulty Laryngoscope Size: McGrath and 3 Grade View: Grade I Tube type: Oral Number of attempts: 1 Airway Equipment and Method: Stylet Placement Confirmation: ETT inserted through vocal cords under direct vision, positive ETCO2 and breath sounds checked- equal and bilateral Secured at: 21 cm Tube secured with: Tape Dental Injury: Teeth and Oropharynx as per pre-operative assessment

## 2023-12-14 NOTE — Interval H&P Note (Signed)
 UROLOGY H&P UPDATE  Agree with prior H&P dated 11/26/2023.  CT showing a 7 mm right proximal ureteral stone with hydronephrosis, urine culture mixed flora.  Cardiac: RRR Lungs: CTA bilaterally  Laterality: Right Procedure: Right ureteroscopy, laser lithotripsy, stent placement  Urine: Culture mixed flora  We specifically discussed the risks ureteroscopy including bleeding, infection/sepsis, stent related symptoms including flank pain/urgency/frequency/incontinence/dysuria, ureteral injury, ureteral stricture, inability to access stone, or need for staged or additional procedures.   Redell JAYSON Burnet, MD 12/14/2023

## 2023-12-15 ENCOUNTER — Encounter: Payer: Self-pay | Admitting: Urology

## 2023-12-15 NOTE — Anesthesia Postprocedure Evaluation (Signed)
 Anesthesia Post Note  Patient: Margaret Sellers  Procedure(s) Performed: CYSTOSCOPY/URETEROSCOPY/HOLMIUM LASER/STENT PLACEMENT (Right: Ureter)  Patient location during evaluation: PACU Anesthesia Type: General Level of consciousness: awake and alert Pain management: pain level controlled Vital Signs Assessment: post-procedure vital signs reviewed and stable Respiratory status: spontaneous breathing, nonlabored ventilation, respiratory function stable and patient connected to nasal cannula oxygen Cardiovascular status: blood pressure returned to baseline and stable Postop Assessment: no apparent nausea or vomiting Anesthetic complications: no   No notable events documented.   Last Vitals:  Vitals:   12/14/23 1549 12/14/23 1609  BP: 116/67 115/64  Pulse: 63 60  Resp: 13 17  Temp: (!) 36.2 C (!) 35.9 C  SpO2: 96% 100%    Last Pain:  Vitals:   12/14/23 1609  TempSrc: Temporal  PainSc: 4                  Debby Mines

## 2023-12-18 ENCOUNTER — Ambulatory Visit (INDEPENDENT_AMBULATORY_CARE_PROVIDER_SITE_OTHER): Admitting: Urology

## 2023-12-18 VITALS — BP 138/83 | HR 62 | Ht 66.0 in | Wt 199.0 lb

## 2023-12-18 DIAGNOSIS — N200 Calculus of kidney: Secondary | ICD-10-CM | POA: Diagnosis not present

## 2023-12-18 MED ORDER — NITROFURANTOIN MONOHYD MACRO 100 MG PO CAPS
100.0000 mg | ORAL_CAPSULE | Freq: Once | ORAL | Status: AC
  Administered 2023-12-18: 100 mg via ORAL

## 2023-12-18 NOTE — Progress Notes (Signed)
 Cystoscopy Procedure Note:  Indication: Stent removal s/p 12/14/2023 right ureteroscopy, laser lithotripsy, stent placement  After informed consent and discussion of the procedure and its risks, Shavone Nevers Eklund was positioned and prepped in the standard fashion. Cystoscopy was performed with a flexible cystoscope. The stent was grasped with flexible graspers and removed in its entirety. The patient tolerated the procedure well.  Findings: Uncomplicated stent removal  Assessment and Plan: Stone type appeared to be calcium oxalate  We discussed general stone prevention strategies including adequate hydration with goal of producing 2.5 L of urine daily, increasing citric acid intake, increasing calcium intake during high oxalate meals, minimizing animal protein, and decreasing salt intake. Information about dietary recommendations given today.   Continue surveillance for 7 mm left lower pole stone, consider shockwave in the future did not tolerate stent well RTC 1 yr KUB  Redell JAYSON Burnet, MD 12/18/2023

## 2023-12-18 NOTE — Patient Instructions (Signed)
 Margaret Sellers

## 2024-01-06 DIAGNOSIS — Z719 Counseling, unspecified: Secondary | ICD-10-CM

## 2024-01-19 ENCOUNTER — Telehealth: Payer: Self-pay

## 2024-01-19 NOTE — Telephone Encounter (Signed)
 Informed pt that return to work paperwork was faxed to [phone] x 2 today at 4:10 pm with confirmation.   Advised pt that if she needed anything further to let me know. Apologized for the confusion.   Pt appreciative of call.

## 2024-01-19 NOTE — Telephone Encounter (Signed)
 [facility]  Need additional questions answered in order to  complete her return to work note.

## 2024-12-14 ENCOUNTER — Ambulatory Visit: Admitting: Physician Assistant
